# Patient Record
Sex: Male | Born: 1970 | Race: Black or African American | Hispanic: No | Marital: Single | Smoking: Never smoker
Health system: Southern US, Community
[De-identification: ages and names within clinical notes are randomized; demographics above are authoritative.]

## PROBLEM LIST (undated history)

## (undated) DIAGNOSIS — Z789 Other specified health status: Secondary | ICD-10-CM

## (undated) DIAGNOSIS — I10 Essential (primary) hypertension: Secondary | ICD-10-CM

## (undated) HISTORY — DX: Essential (primary) hypertension: I10

---

## 2005-08-30 ENCOUNTER — Emergency Department (HOSPITAL_COMMUNITY): Admission: EM | Admit: 2005-08-30 | Discharge: 2005-08-30 | Payer: Self-pay | Admitting: Emergency Medicine

## 2011-01-28 ENCOUNTER — Inpatient Hospital Stay (HOSPITAL_COMMUNITY)

## 2011-01-28 ENCOUNTER — Emergency Department (HOSPITAL_COMMUNITY)

## 2011-01-28 ENCOUNTER — Inpatient Hospital Stay (HOSPITAL_COMMUNITY)
Admission: EM | Admit: 2011-01-28 | Discharge: 2011-02-05 | DRG: 957 | Disposition: A | Discharge status: Home Health | Attending: General Surgery | Admitting: General Surgery

## 2011-01-28 DIAGNOSIS — T794XXA Traumatic shock, initial encounter: Secondary | ICD-10-CM | POA: Diagnosis present

## 2011-01-28 DIAGNOSIS — S3660XA Unspecified injury of rectum, initial encounter: Secondary | ICD-10-CM | POA: Diagnosis present

## 2011-01-28 DIAGNOSIS — F17200 Nicotine dependence, unspecified, uncomplicated: Secondary | ICD-10-CM | POA: Diagnosis present

## 2011-01-28 DIAGNOSIS — Z22322 Carrier or suspected carrier of Methicillin resistant Staphylococcus aureus: Secondary | ICD-10-CM

## 2011-01-28 DIAGNOSIS — F1020 Alcohol dependence, uncomplicated: Secondary | ICD-10-CM | POA: Diagnosis present

## 2011-01-28 DIAGNOSIS — Y929 Unspecified place or not applicable: Secondary | ICD-10-CM

## 2011-01-28 DIAGNOSIS — I2699 Other pulmonary embolism without acute cor pulmonale: Secondary | ICD-10-CM | POA: Diagnosis not present

## 2011-01-28 DIAGNOSIS — S31809A Unspecified open wound of unspecified buttock, initial encounter: Secondary | ICD-10-CM | POA: Diagnosis present

## 2011-01-28 DIAGNOSIS — R0902 Hypoxemia: Secondary | ICD-10-CM | POA: Diagnosis not present

## 2011-01-28 DIAGNOSIS — K560 Paralytic ileus: Secondary | ICD-10-CM | POA: Diagnosis not present

## 2011-01-28 DIAGNOSIS — D62 Acute posthemorrhagic anemia: Secondary | ICD-10-CM | POA: Diagnosis not present

## 2011-01-28 DIAGNOSIS — I959 Hypotension, unspecified: Secondary | ICD-10-CM | POA: Diagnosis present

## 2011-01-28 DIAGNOSIS — S2190XA Unspecified open wound of unspecified part of thorax, initial encounter: Principal | ICD-10-CM | POA: Diagnosis present

## 2011-01-28 DIAGNOSIS — K659 Peritonitis, unspecified: Secondary | ICD-10-CM | POA: Diagnosis present

## 2011-01-28 DIAGNOSIS — I4891 Unspecified atrial fibrillation: Secondary | ICD-10-CM | POA: Diagnosis not present

## 2011-01-28 DIAGNOSIS — S71009A Unspecified open wound, unspecified hip, initial encounter: Secondary | ICD-10-CM | POA: Diagnosis present

## 2011-01-28 DIAGNOSIS — S71109A Unspecified open wound, unspecified thigh, initial encounter: Secondary | ICD-10-CM | POA: Diagnosis present

## 2011-01-28 DIAGNOSIS — J9600 Acute respiratory failure, unspecified whether with hypoxia or hypercapnia: Secondary | ICD-10-CM | POA: Diagnosis not present

## 2011-01-28 HISTORY — PX: ABDOMINAL SURGERY: SHX537

## 2011-01-28 LAB — COMPREHENSIVE METABOLIC PANEL
ALT: 15 U/L (ref 0–53)
AST: 21 U/L (ref 0–37)
Albumin: 2.9 g/dL — ABNORMAL LOW (ref 3.5–5.2)
Alkaline Phosphatase: 59 U/L (ref 39–117)
BUN: 17 mg/dL (ref 6–23)
Calcium: 9.2 mg/dL (ref 8.4–10.5)
Calcium: 8.2 mg/dL — ABNORMAL LOW (ref 8.4–10.5)
Glucose, Bld: 159 mg/dL — ABNORMAL HIGH (ref 70–99)
Potassium: 4.7 mEq/L (ref 3.5–5.1)
Sodium: 141 mEq/L (ref 135–145)
Sodium: 141 mEq/L (ref 135–145)
Total Protein: 7.5 g/dL (ref 6.0–8.3)
Total Protein: 5.4 g/dL — ABNORMAL LOW (ref 6.0–8.3)

## 2011-01-28 LAB — BLOOD GAS, ARTERIAL
Acid-base deficit: 2.6 mmol/L — ABNORMAL HIGH (ref 0.0–2.0)
Bicarbonate: 21.9 mEq/L (ref 20.0–24.0)
FIO2: 0.7 %
MECHVT: 600 mL
TCO2: 23.1 mmol/L (ref 0–100)
pCO2 arterial: 38.6 mmHg (ref 35.0–45.0)
pO2, Arterial: 132 mmHg — ABNORMAL HIGH (ref 80.0–100.0)

## 2011-01-28 LAB — CBC
HCT: 41 % (ref 39.0–52.0)
Hemoglobin: 13.7 g/dL (ref 13.0–17.0)
MCH: 28.7 pg (ref 26.0–34.0)
MCH: 29.1 pg (ref 26.0–34.0)
MCHC: 33.4 g/dL (ref 30.0–36.0)
MCV: 86 fL (ref 78.0–100.0)
Platelets: 359 K/uL (ref 150–400)
Platelets: 176 10*3/uL (ref 150–400)
RBC: 4.77 MIL/uL (ref 4.22–5.81)
RBC: 3.51 MIL/uL — ABNORMAL LOW (ref 4.22–5.81)
RDW: 13.4 % (ref 11.5–15.5)
RDW: 13.3 % (ref 11.5–15.5)
WBC: 14 10*3/uL — ABNORMAL HIGH (ref 4.0–10.5)

## 2011-01-28 LAB — LACTIC ACID, PLASMA
Lactic Acid, Venous: 20.2 mmol/L — ABNORMAL HIGH (ref 0.5–2.2)
Lactic Acid, Venous: 2.2 mmol/L (ref 0.5–2.2)

## 2011-01-28 LAB — POCT I-STAT 7, (LYTES, BLD GAS, ICA,H+H)
Bicarbonate: 16.8 meq/L — ABNORMAL LOW (ref 20.0–24.0)
Hemoglobin: 12.9 g/dL — ABNORMAL LOW (ref 13.0–17.0)
Patient temperature: 35.4
Potassium: 4.2 meq/L (ref 3.5–5.1)
TCO2: 18 mmol/L (ref 0–100)
pCO2 arterial: 39.8 mmHg (ref 35.0–45.0)
pH, Arterial: 7.224 — ABNORMAL LOW (ref 7.350–7.450)

## 2011-01-28 LAB — POCT I-STAT 3, ART BLOOD GAS (G3+)
Bicarbonate: 23.7 meq/L (ref 20.0–24.0)
O2 Saturation: 96 %
TCO2: 25 mmol/L (ref 0–100)
pCO2 arterial: 46.1 mmHg — ABNORMAL HIGH (ref 35.0–45.0)
pCO2 arterial: 48.6 mmHg — ABNORMAL HIGH (ref 35.0–45.0)
pH, Arterial: 7.316 — ABNORMAL LOW (ref 7.350–7.450)
pH, Arterial: 7.291 — ABNORMAL LOW (ref 7.350–7.450)
pO2, Arterial: 90 mmHg (ref 80.0–100.0)

## 2011-01-28 LAB — COMPREHENSIVE METABOLIC PANEL WITH GFR
ALT: 21 U/L (ref 0–53)
AST: 32 U/L (ref 0–37)
Albumin: 3.8 g/dL (ref 3.5–5.2)
Alkaline Phosphatase: 72 U/L (ref 39–117)
CO2: 9 meq/L — CL (ref 19–32)
Chloride: 100 meq/L (ref 96–112)
Creatinine, Ser: 1.84 mg/dL — ABNORMAL HIGH (ref 0.4–1.5)
Potassium: 3.9 meq/L (ref 3.5–5.1)
Total Bilirubin: 0.9 mg/dL (ref 0.3–1.2)

## 2011-01-28 LAB — POCT I-STAT, CHEM 8
BUN: 20 mg/dL (ref 6–23)
BUN: 21 mg/dL (ref 6–23)
Chloride: 110 meq/L (ref 96–112)
Creatinine, Ser: 1.8 mg/dL — ABNORMAL HIGH (ref 0.4–1.5)
Glucose, Bld: 149 mg/dL — ABNORMAL HIGH (ref 70–99)
HCT: 45 % (ref 39.0–52.0)
Hemoglobin: 9.2 g/dL — ABNORMAL LOW (ref 13.0–17.0)
Potassium: 4 meq/L (ref 3.5–5.1)
Potassium: 4.8 meq/L (ref 3.5–5.1)
Sodium: 140 meq/L (ref 135–145)

## 2011-01-28 LAB — PROTIME-INR
INR: 1.41 (ref 0.00–1.49)
INR: 1.32 (ref 0.00–1.49)
Prothrombin Time: 17.5 seconds — ABNORMAL HIGH (ref 11.6–15.2)
Prothrombin Time: 16.6 seconds — ABNORMAL HIGH (ref 11.6–15.2)

## 2011-01-28 LAB — SAMPLE TO BLOOD BANK

## 2011-01-28 LAB — HEMOGLOBIN AND HEMATOCRIT, BLOOD
HCT: 26.5 % — ABNORMAL LOW (ref 39.0–52.0)
HCT: 26.1 % — ABNORMAL LOW (ref 39.0–52.0)
Hemoglobin: 9.1 g/dL — ABNORMAL LOW (ref 13.0–17.0)

## 2011-01-29 ENCOUNTER — Inpatient Hospital Stay (HOSPITAL_COMMUNITY)

## 2011-01-29 LAB — DIFFERENTIAL
Eosinophils Absolute: 0 10*3/uL (ref 0.0–0.7)
Lymphocytes Relative: 14 % (ref 12–46)
Lymphs Abs: 1.5 10*3/uL (ref 0.7–4.0)
Monocytes Relative: 8 % (ref 3–12)
Neutro Abs: 8.2 10*3/uL — ABNORMAL HIGH (ref 1.7–7.7)
Neutrophils Relative %: 78 % — ABNORMAL HIGH (ref 43–77)

## 2011-01-29 LAB — POCT I-STAT 3, ART BLOOD GAS (G3+)
Acid-base deficit: 2 mmol/L (ref 0.0–2.0)
Acid-base deficit: 4 mmol/L — ABNORMAL HIGH (ref 0.0–2.0)
Acid-base deficit: 4 mmol/L — ABNORMAL HIGH (ref 0.0–2.0)
Bicarbonate: 20.8 meq/L (ref 20.0–24.0)
Bicarbonate: 22.3 meq/L (ref 20.0–24.0)
Bicarbonate: 23.8 meq/L (ref 20.0–24.0)
Bicarbonate: 23.8 meq/L (ref 20.0–24.0)
O2 Saturation: 90 %
Patient temperature: 100.7
Patient temperature: 100.9
Patient temperature: 99
TCO2: 25 mmol/L (ref 0–100)
TCO2: 25 mmol/L (ref 0–100)
pCO2 arterial: 46.4 mmHg — ABNORMAL HIGH (ref 35.0–45.0)
pCO2 arterial: 42.4 mmHg (ref 35.0–45.0)
pH, Arterial: 7.262 — ABNORMAL LOW (ref 7.350–7.450)
pH, Arterial: 7.34 — ABNORMAL LOW (ref 7.350–7.450)
pH, Arterial: 7.411 (ref 7.350–7.450)
pH, Arterial: 7.324 — ABNORMAL LOW (ref 7.350–7.450)
pH, Arterial: 7.363 (ref 7.350–7.450)
pO2, Arterial: 76 mmHg — ABNORMAL LOW (ref 80.0–100.0)

## 2011-01-29 LAB — CBC
HCT: 21.8 % — ABNORMAL LOW (ref 39.0–52.0)
Hemoglobin: 8 g/dL — ABNORMAL LOW (ref 13.0–17.0)
Hemoglobin: 7.6 g/dL — ABNORMAL LOW (ref 13.0–17.0)
MCV: 84.1 fL (ref 78.0–100.0)
MCV: 86.2 fL (ref 78.0–100.0)
Platelets: 177 10*3/uL (ref 150–400)
RBC: 2.71 MIL/uL — ABNORMAL LOW (ref 4.22–5.81)
RBC: 2.53 MIL/uL — ABNORMAL LOW (ref 4.22–5.81)
WBC: 10.6 10*3/uL — ABNORMAL HIGH (ref 4.0–10.5)
WBC: 12.7 10*3/uL — ABNORMAL HIGH (ref 4.0–10.5)

## 2011-01-29 LAB — BLOOD GAS, ARTERIAL
Acid-base deficit: 1.2 mmol/L (ref 0.0–2.0)
Bicarbonate: 22.6 mEq/L (ref 20.0–24.0)
FIO2: 0.5 %
TCO2: 23.6 mmol/L (ref 0–100)
pCO2 arterial: 36.8 mmHg (ref 35.0–45.0)
pH, Arterial: 7.409 (ref 7.350–7.450)
pO2, Arterial: 99.3 mmHg (ref 80.0–100.0)

## 2011-01-29 LAB — BASIC METABOLIC PANEL
CO2: 23 mEq/L (ref 19–32)
Chloride: 108 mEq/L (ref 96–112)
Creatinine, Ser: 1.54 mg/dL — ABNORMAL HIGH (ref 0.4–1.5)
Glucose, Bld: 163 mg/dL — ABNORMAL HIGH (ref 70–99)

## 2011-01-29 LAB — PREPARE FRESH FROZEN PLASMA
Unit division: 0
Unit division: 0
Unit division: 0

## 2011-01-30 ENCOUNTER — Inpatient Hospital Stay (HOSPITAL_COMMUNITY)

## 2011-01-30 LAB — DIFFERENTIAL
Eosinophils Relative: 0 % (ref 0–5)
Lymphocytes Relative: 13 % (ref 12–46)
Lymphs Abs: 1.4 10*3/uL (ref 0.7–4.0)
Monocytes Absolute: 1.1 10*3/uL — ABNORMAL HIGH (ref 0.1–1.0)
Monocytes Relative: 10 % (ref 3–12)

## 2011-01-30 LAB — CBC
HCT: 20.3 % — ABNORMAL LOW (ref 39.0–52.0)
MCHC: 34.5 g/dL (ref 30.0–36.0)
MCV: 87.1 fL (ref 78.0–100.0)
RDW: 14 % (ref 11.5–15.5)
WBC: 11.4 10*3/uL — ABNORMAL HIGH (ref 4.0–10.5)

## 2011-01-30 LAB — BASIC METABOLIC PANEL
BUN: 12 mg/dL (ref 6–23)
Chloride: 105 mEq/L (ref 96–112)
Creatinine, Ser: 1.17 mg/dL (ref 0.4–1.5)
GFR calc Af Amer: >60 mL/min (ref >60)

## 2011-01-31 ENCOUNTER — Inpatient Hospital Stay (HOSPITAL_COMMUNITY)

## 2011-01-31 DIAGNOSIS — I4891 Unspecified atrial fibrillation: Secondary | ICD-10-CM

## 2011-01-31 DIAGNOSIS — I2699 Other pulmonary embolism without acute cor pulmonale: Secondary | ICD-10-CM

## 2011-01-31 LAB — BASIC METABOLIC PANEL
BUN: 11 mg/dL (ref 6–23)
Calcium: 8 mg/dL — ABNORMAL LOW (ref 8.4–10.5)
Creatinine, Ser: 1.08 mg/dL (ref 0.4–1.5)
GFR calc non Af Amer: >60 mL/min (ref >60)
Glucose, Bld: 107 mg/dL — ABNORMAL HIGH (ref 70–99)

## 2011-01-31 LAB — CBC
HCT: 19.3 % — ABNORMAL LOW (ref 39.0–52.0)
HCT: 25.4 % — ABNORMAL LOW (ref 39.0–52.0)
Hemoglobin: 6.6 g/dL — CL (ref 13.0–17.0)
Hemoglobin: 8.6 g/dL — ABNORMAL LOW (ref 13.0–17.0)
MCH: 29.6 pg (ref 26.0–34.0)
MCH: 28.8 pg (ref 26.0–34.0)
MCHC: 34.2 g/dL (ref 30.0–36.0)
MCHC: 33.9 g/dL (ref 30.0–36.0)
MCV: 86.5 fL (ref 78.0–100.0)
MCV: 84.9 fL (ref 78.0–100.0)
RDW: 14.2 % (ref 11.5–15.5)

## 2011-01-31 LAB — DIFFERENTIAL
Basophils Relative: 1 % (ref 0–1)
Monocytes Absolute: 0.9 10*3/uL (ref 0.1–1.0)
Monocytes Relative: 9 % (ref 3–12)
Neutro Abs: 6.2 10*3/uL (ref 1.7–7.7)

## 2011-01-31 MED ORDER — IOHEXOL 300 MG/ML  SOLN
100.0000 mL | Freq: Once | INTRAMUSCULAR | Status: AC | PRN
  Administered 2011-01-31: 75 mL via INTRAVENOUS

## 2011-02-01 ENCOUNTER — Inpatient Hospital Stay (HOSPITAL_COMMUNITY)

## 2011-02-01 DIAGNOSIS — I808 Phlebitis and thrombophlebitis of other sites: Secondary | ICD-10-CM

## 2011-02-01 LAB — TYPE AND SCREEN
ABO/RH(D): O POS
Antibody Screen: NEGATIVE
Unit division: 0
Unit division: 0
Unit division: 0
Unit division: 0
Unit division: 0
Unit division: 0

## 2011-02-01 LAB — BASIC METABOLIC PANEL
BUN: 12 mg/dL (ref 6–23)
Chloride: 103 mEq/L (ref 96–112)
Creatinine, Ser: 1.06 mg/dL (ref 0.4–1.5)
GFR calc Af Amer: >60 mL/min (ref >60)
Glucose, Bld: 119 mg/dL — ABNORMAL HIGH (ref 70–99)
Potassium: 3.7 mEq/L (ref 3.5–5.1)

## 2011-02-01 LAB — DIFFERENTIAL
Basophils Absolute: 0 10*3/uL (ref 0.0–0.1)
Eosinophils Relative: 2 % (ref 0–5)
Lymphocytes Relative: 22 % (ref 12–46)
Lymphs Abs: 1.7 10*3/uL (ref 0.7–4.0)
Monocytes Absolute: 0.8 10*3/uL (ref 0.1–1.0)
Neutro Abs: 5.3 10*3/uL (ref 1.7–7.7)

## 2011-02-01 LAB — CBC
HCT: 25.5 % — ABNORMAL LOW (ref 39.0–52.0)
Hemoglobin: 8.8 g/dL — ABNORMAL LOW (ref 13.0–17.0)
MCHC: 34.5 g/dL (ref 30.0–36.0)
MCV: 84.7 fL (ref 78.0–100.0)
RDW: 14.3 % (ref 11.5–15.5)
WBC: 8 10*3/uL (ref 4.0–10.5)

## 2011-02-02 ENCOUNTER — Inpatient Hospital Stay (HOSPITAL_COMMUNITY)

## 2011-02-02 LAB — CBC
HCT: 27 % — ABNORMAL LOW (ref 39.0–52.0)
MCH: 29.6 pg (ref 26.0–34.0)
MCHC: 34.8 g/dL (ref 30.0–36.0)
MCV: 84.9 fL (ref 78.0–100.0)
Platelets: 246 10*3/uL (ref 150–400)
RDW: 13.9 % (ref 11.5–15.5)

## 2011-02-02 LAB — BASIC METABOLIC PANEL
BUN: 13 mg/dL (ref 6–23)
CO2: 24 mEq/L (ref 19–32)
Calcium: 8.4 mg/dL (ref 8.4–10.5)
Creatinine, Ser: 0.96 mg/dL (ref 0.50–1.35)
GFR calc non Af Amer: >60 mL/min (ref >60)
Glucose, Bld: 117 mg/dL — ABNORMAL HIGH (ref 70–99)

## 2011-02-02 LAB — DIFFERENTIAL
Eosinophils Absolute: 0.1 10*3/uL (ref 0.0–0.7)
Eosinophils Relative: 1 % (ref 0–5)
Lymphocytes Relative: 19 % (ref 12–46)
Lymphs Abs: 1.5 10*3/uL (ref 0.7–4.0)
Monocytes Absolute: 0.8 10*3/uL (ref 0.1–1.0)

## 2011-02-03 ENCOUNTER — Inpatient Hospital Stay (HOSPITAL_COMMUNITY)

## 2011-02-03 LAB — DIFFERENTIAL
Basophils Relative: 0 % (ref 0–1)
Eosinophils Absolute: 0.2 10*3/uL (ref 0.0–0.7)
Eosinophils Relative: 2 % (ref 0–5)
Monocytes Absolute: 1 10*3/uL (ref 0.1–1.0)
Monocytes Relative: 10 % (ref 3–12)
Neutro Abs: 6.4 10*3/uL (ref 1.7–7.7)

## 2011-02-03 LAB — CBC
Hemoglobin: 9.4 g/dL — ABNORMAL LOW (ref 13.0–17.0)
MCH: 28.7 pg (ref 26.0–34.0)
MCHC: 33.6 g/dL (ref 30.0–36.0)
RDW: 14.3 % (ref 11.5–15.5)

## 2011-02-03 LAB — BASIC METABOLIC PANEL
BUN: 16 mg/dL (ref 6–23)
Calcium: 9 mg/dL (ref 8.4–10.5)
Chloride: 106 mEq/L (ref 96–112)
Creatinine, Ser: 1.01 mg/dL (ref 0.50–1.35)
GFR calc Af Amer: >60 mL/min (ref >60)

## 2011-02-03 LAB — PROTIME-INR: Prothrombin Time: 24.6 seconds — ABNORMAL HIGH (ref 11.6–15.2)

## 2011-02-04 ENCOUNTER — Inpatient Hospital Stay (HOSPITAL_COMMUNITY)

## 2011-02-04 LAB — DIFFERENTIAL
Basophils Relative: 1 % (ref 0–1)
Eosinophils Relative: 2 % (ref 0–5)
Lymphs Abs: 2.1 10*3/uL (ref 0.7–4.0)
Monocytes Absolute: 1.2 10*3/uL — ABNORMAL HIGH (ref 0.1–1.0)

## 2011-02-04 LAB — BASIC METABOLIC PANEL
BUN: 17 mg/dL (ref 6–23)
Calcium: 8.9 mg/dL (ref 8.4–10.5)
Creatinine, Ser: 0.96 mg/dL (ref 0.50–1.35)
GFR calc non Af Amer: >60 mL/min (ref >60)
Glucose, Bld: 138 mg/dL — ABNORMAL HIGH (ref 70–99)

## 2011-02-04 LAB — CBC
MCH: 29.2 pg (ref 26.0–34.0)
MCV: 84.2 fL (ref 78.0–100.0)
Platelets: 335 10*3/uL (ref 150–400)
RDW: 14 % (ref 11.5–15.5)

## 2011-02-05 ENCOUNTER — Inpatient Hospital Stay (HOSPITAL_COMMUNITY)

## 2011-02-05 LAB — DIFFERENTIAL
Basophils Absolute: 0 10*3/uL (ref 0.0–0.1)
Basophils Relative: 0 % (ref 0–1)
Monocytes Relative: 10 % (ref 3–12)
Neutro Abs: 6 10*3/uL (ref 1.7–7.7)
Neutrophils Relative %: 65 % (ref 43–77)

## 2011-02-05 LAB — BASIC METABOLIC PANEL
Chloride: 104 mEq/L (ref 96–112)
GFR calc Af Amer: >60 mL/min (ref >60)
Potassium: 4.3 mEq/L (ref 3.5–5.1)
Sodium: 135 mEq/L (ref 135–145)

## 2011-02-05 LAB — PROTIME-INR
INR: 2.66 — ABNORMAL HIGH (ref 0.00–1.49)
Prothrombin Time: 28.8 seconds — ABNORMAL HIGH (ref 11.6–15.2)

## 2011-02-05 LAB — CBC
Hemoglobin: 9.6 g/dL — ABNORMAL LOW (ref 13.0–17.0)
RBC: 3.36 MIL/uL — ABNORMAL LOW (ref 4.22–5.81)
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
## 2011-02-18 NOTE — Op Note (Signed)
  NAME:  Michael Barber, Michael Barber NO.:  [id number]  MEDICAL RECORD NO.:  [id number]  LOCATION:  2311                         FACILITY:  [facility]  PHYSICIAN:  Almond Lint, MD       DATE OF BIRTH:  [birth year]  DATE OF PROCEDURE:  01/28/2011 DATE OF DISCHARGE:                              OPERATIVE REPORT   PREOPERATIVE DIAGNOSIS:  Increased hemothorax.  POSTOPERATIVE DIAGNOSIS:  Increased hemothorax.  PROCEDURE:  Left 40-French tube thoracostomy.  SURGEON:  Almond Lint, MD  ANESTHESIA:  General and local.  FINDINGS:  Around 200 mL of blood on the bed and around 300 mL in the Pleur-Evac.  SPECIMENS:  None.  ESTIMATED BLOOD LOSS:  500.  PROCEDURE:  Mr. Baby was identified in the ICU.  Informed consent was obtained.  His left chest x-ray was reviewed demonstrating increased hemothorax since this morning.  He was given some sedation with Versed and had already gotten some Dilaudid for pain.  His chest was prepped and draped in sterile fashion.  The anterior axillary line was anesthetized with 2% lidocaine.  A #11 blade was used to make an incision.  The tube was secured with 2-0 silk at 12 cm.  The patient originally tolerated the procedure well and became tachycardic.  He was given dose of Narcan and we are awaiting a chest x-ray.     Almond Lint, MD     FB/MEDQ  D:  01/28/2011  T:  01/29/2011  Job:  [id number]  Electronically Signed by Almond Lint MD on 02/18/2011 12:45:21 PM
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
## 2011-03-14 ENCOUNTER — Telehealth (INDEPENDENT_AMBULATORY_CARE_PROVIDER_SITE_OTHER): Payer: Self-pay | Admitting: Orthopedic Surgery

## 2011-03-14 NOTE — Telephone Encounter (Signed)
Patient's mother called requesting a return to work note. Will provide.

## 2011-03-19 ENCOUNTER — Telehealth (INDEPENDENT_AMBULATORY_CARE_PROVIDER_SITE_OTHER): Payer: Self-pay | Admitting: General Surgery

## 2011-03-19 NOTE — Telephone Encounter (Signed)
Patient's sister called to say they need a note from Dr Lindie Spruce for the [employer]. Needs to state that he had an extensive surgery and went into A-fib in recovery and that the patient hasn't been released for back to work yet. Please call sister when note is ready. Thanks.

## 2011-03-21 ENCOUNTER — Encounter (INDEPENDENT_AMBULATORY_CARE_PROVIDER_SITE_OTHER): Payer: Self-pay | Admitting: Orthopedic Surgery

## 2011-03-21 ENCOUNTER — Telehealth (INDEPENDENT_AMBULATORY_CARE_PROVIDER_SITE_OTHER): Payer: Self-pay | Admitting: Orthopedic Surgery

## 2011-03-21 NOTE — Telephone Encounter (Signed)
Britta Mccreedy returned my earlier phone call and asked for the letter to be faxed to [phone].

## 2011-03-21 NOTE — Telephone Encounter (Signed)
Left message to say his work note was ready.

## 2011-04-10 ENCOUNTER — Telehealth (INDEPENDENT_AMBULATORY_CARE_PROVIDER_SITE_OTHER): Payer: Self-pay | Admitting: Orthopedic Surgery

## 2011-04-10 NOTE — Telephone Encounter (Signed)
Received pharmacy request for refill for Norco 10/325. Denied as >2 months from surgery.

## 2018-03-24 ENCOUNTER — Encounter (HOSPITAL_COMMUNITY): Payer: Self-pay | Admitting: Internal Medicine

## 2018-03-24 ENCOUNTER — Encounter: Payer: Self-pay | Admitting: Emergency Medicine

## 2018-03-24 ENCOUNTER — Inpatient Hospital Stay (HOSPITAL_COMMUNITY)
Admission: EM | Admit: 2018-03-24 | Discharge: 2018-03-27 | DRG: 418 | Disposition: A | Discharge status: Home / Self Care | Payer: Self-pay | Source: Other Acute Inpatient Hospital | Attending: Internal Medicine | Admitting: Internal Medicine

## 2018-03-24 ENCOUNTER — Inpatient Hospital Stay
Admission: EM | Admit: 2018-03-24 | Discharge: 2018-03-24 | DRG: 445 | Disposition: A | Discharge status: Other Acute Inpatient Hospital | Payer: Self-pay | Attending: Internal Medicine | Admitting: Internal Medicine

## 2018-03-24 ENCOUNTER — Other Ambulatory Visit: Payer: Self-pay

## 2018-03-24 ENCOUNTER — Inpatient Hospital Stay: Payer: Self-pay

## 2018-03-24 ENCOUNTER — Emergency Department: Payer: Self-pay

## 2018-03-24 DIAGNOSIS — K810 Acute cholecystitis: Secondary | ICD-10-CM | POA: Diagnosis present

## 2018-03-24 DIAGNOSIS — E860 Dehydration: Secondary | ICD-10-CM | POA: Diagnosis present

## 2018-03-24 DIAGNOSIS — R945 Abnormal results of liver function studies: Secondary | ICD-10-CM

## 2018-03-24 DIAGNOSIS — R7989 Other specified abnormal findings of blood chemistry: Secondary | ICD-10-CM | POA: Diagnosis present

## 2018-03-24 DIAGNOSIS — K8000 Calculus of gallbladder with acute cholecystitis without obstruction: Principal | ICD-10-CM | POA: Diagnosis present

## 2018-03-24 DIAGNOSIS — K8063 Calculus of gallbladder and bile duct with acute cholecystitis with obstruction: Principal | ICD-10-CM | POA: Diagnosis present

## 2018-03-24 DIAGNOSIS — N179 Acute kidney failure, unspecified: Secondary | ICD-10-CM | POA: Diagnosis present

## 2018-03-24 DIAGNOSIS — F17290 Nicotine dependence, other tobacco product, uncomplicated: Secondary | ICD-10-CM | POA: Diagnosis present

## 2018-03-24 DIAGNOSIS — Z6832 Body mass index (BMI) 32.0-32.9, adult: Secondary | ICD-10-CM

## 2018-03-24 DIAGNOSIS — I4891 Unspecified atrial fibrillation: Secondary | ICD-10-CM | POA: Diagnosis present

## 2018-03-24 DIAGNOSIS — E669 Obesity, unspecified: Secondary | ICD-10-CM | POA: Diagnosis present

## 2018-03-24 DIAGNOSIS — I10 Essential (primary) hypertension: Secondary | ICD-10-CM | POA: Diagnosis present

## 2018-03-24 DIAGNOSIS — K760 Fatty (change of) liver, not elsewhere classified: Secondary | ICD-10-CM | POA: Diagnosis present

## 2018-03-24 DIAGNOSIS — Z86711 Personal history of pulmonary embolism: Secondary | ICD-10-CM

## 2018-03-24 DIAGNOSIS — R17 Unspecified jaundice: Secondary | ICD-10-CM

## 2018-03-24 HISTORY — DX: Other specified health status: Z78.9

## 2018-03-24 LAB — URINALYSIS, COMPLETE (UACMP) WITH MICROSCOPIC
Bacteria, UA: NONE SEEN
Bilirubin Urine: NEGATIVE
Glucose, UA: NEGATIVE mg/dL
Ketones, ur: NEGATIVE mg/dL
Leukocytes, UA: NEGATIVE
NITRITE: NEGATIVE
PROTEIN: NEGATIVE mg/dL
SPECIFIC GRAVITY, URINE: 1.013 (ref 1.005–1.030)
Squamous Epithelial / LPF: NONE SEEN (ref 0–5)
pH: 5 (ref 5.0–8.0)

## 2018-03-24 LAB — COMPREHENSIVE METABOLIC PANEL
ALT: 202 U/L — ABNORMAL HIGH (ref 0–44)
ANION GAP: 10 (ref 5–15)
AST: 194 U/L — AB (ref 15–41)
Albumin: 4.4 g/dL (ref 3.5–5.0)
Alkaline Phosphatase: 109 U/L (ref 38–126)
BUN: 16 mg/dL (ref 6–20)
CALCIUM: 9.2 mg/dL (ref 8.9–10.3)
CO2: 24 mmol/L (ref 22–32)
CREATININE: 1.48 mg/dL — AB (ref 0.61–1.24)
Chloride: 101 mmol/L (ref 98–111)
GFR, EST NON AFRICAN AMERICAN: 54 mL/min — AB (ref >60)
GLUCOSE: 143 mg/dL — AB (ref 70–99)
Potassium: 4.3 mmol/L (ref 3.5–5.1)
Sodium: 135 mmol/L (ref 135–145)
TOTAL PROTEIN: 8.4 g/dL — AB (ref 6.5–8.1)
Total Bilirubin: 6.2 mg/dL — ABNORMAL HIGH (ref 0.3–1.2)

## 2018-03-24 LAB — LACTIC ACID, PLASMA: Lactic Acid, Venous: 1.3 mmol/L (ref 0.5–1.9)

## 2018-03-24 LAB — CBC
HCT: 43.6 % (ref 40.0–52.0)
HEMOGLOBIN: 15 g/dL (ref 13.0–18.0)
MCH: 29 pg (ref 26.0–34.0)
MCHC: 34.4 g/dL (ref 32.0–36.0)
MCV: 84.2 fL (ref 80.0–100.0)
PLATELETS: 278 10*3/uL (ref 150–440)
RBC: 5.17 MIL/uL (ref 4.40–5.90)
RDW: 13.6 % (ref 11.5–14.5)
WBC: 10.3 10*3/uL (ref 3.8–10.6)

## 2018-03-24 LAB — LIPASE, BLOOD: LIPASE: 43 U/L (ref 11–51)

## 2018-03-24 MED ORDER — ACETAMINOPHEN 650 MG RE SUPP
650.0000 mg | Freq: Four times a day (QID) | RECTAL | Status: DC | PRN
Start: 2018-03-24 — End: 2018-03-24

## 2018-03-24 MED ORDER — PIPERACILLIN-TAZOBACTAM 3.375 G IVPB 30 MIN
3.3750 g | Freq: Once | INTRAVENOUS | Status: AC
  Administered 2018-03-24: 3.375 g via INTRAVENOUS
  Filled 2018-03-24 (×2): qty 50

## 2018-03-24 MED ORDER — ONDANSETRON HCL 4 MG PO TABS: 4.0000 mg | ORAL_TABLET | Freq: Four times a day (QID) | ORAL | Status: DC | PRN

## 2018-03-24 MED ORDER — BISACODYL 5 MG PO TBEC: 5.0000 mg | DELAYED_RELEASE_TABLET | Freq: Every day | ORAL | Status: DC | PRN

## 2018-03-24 MED ORDER — PIPERACILLIN-TAZOBACTAM 3.375 G IVPB
3.3750 g | Freq: Three times a day (TID) | INTRAVENOUS | Status: DC
  Administered 2018-03-24: 3.375 g via INTRAVENOUS
  Filled 2018-03-24 (×4): qty 50

## 2018-03-24 MED ORDER — ACETAMINOPHEN 325 MG PO TABS: 650.0000 mg | ORAL_TABLET | Freq: Four times a day (QID) | ORAL | Status: DC | PRN

## 2018-03-24 MED ORDER — SODIUM CHLORIDE 0.9 % IV SOLN
Freq: Once | INTRAVENOUS | Status: AC
  Administered 2018-03-24: 15:00:00 via INTRAVENOUS

## 2018-03-24 MED ORDER — FLEET ENEMA 7-19 GM/118ML RE ENEM: 1.0000 | ENEMA | Freq: Once | RECTAL | Status: DC | PRN

## 2018-03-24 MED ORDER — SODIUM CHLORIDE 0.9 % IV SOLN
2.0000 g | INTRAVENOUS | Status: DC
  Administered 2018-03-24: 2 g via INTRAVENOUS
  Filled 2018-03-24: qty 2

## 2018-03-24 MED ORDER — LACTATED RINGERS IV BOLUS
1000.0000 mL | Freq: Once | INTRAVENOUS | Status: AC
  Administered 2018-03-24: 1000 mL via INTRAVENOUS

## 2018-03-24 MED ORDER — MORPHINE SULFATE (PF) 4 MG/ML IV SOLN
4.0000 mg | Freq: Once | INTRAVENOUS | Status: AC
  Administered 2018-03-24: 4 mg via INTRAVENOUS
  Filled 2018-03-24: qty 1

## 2018-03-24 MED ORDER — ONDANSETRON HCL 4 MG/2ML IJ SOLN: 4.0000 mg | Freq: Four times a day (QID) | INTRAMUSCULAR | Status: DC | PRN

## 2018-03-24 MED ORDER — HEPARIN SODIUM (PORCINE) 5000 UNIT/ML IJ SOLN
5000.0000 [IU] | Freq: Three times a day (TID) | INTRAMUSCULAR | Status: DC
  Administered 2018-03-24: 5000 [IU] via SUBCUTANEOUS
  Filled 2018-03-24 (×2): qty 1

## 2018-03-24 MED ORDER — SODIUM CHLORIDE 0.9 % IV SOLN
INTRAVENOUS | Status: DC
  Administered 2018-03-24: 17:00:00 via INTRAVENOUS

## 2018-03-24 MED ORDER — HYDROCODONE-ACETAMINOPHEN 5-325 MG PO TABS
1.0000 | ORAL_TABLET | ORAL | Status: DC | PRN
  Administered 2018-03-24: 1 via ORAL
  Filled 2018-03-24: qty 1

## 2018-03-24 MED ORDER — SODIUM CHLORIDE 0.9 % IV SOLN: INTRAVENOUS | Status: DC

## 2018-03-24 MED ORDER — ONDANSETRON HCL 4 MG/2ML IJ SOLN
4.0000 mg | Freq: Four times a day (QID) | INTRAMUSCULAR | Status: DC | PRN
  Administered 2018-03-26: 4 mg via INTRAVENOUS

## 2018-03-24 MED ORDER — GADOBENATE DIMEGLUMINE 529 MG/ML IV SOLN
20.0000 mL | Freq: Once | INTRAVENOUS | Status: AC | PRN
  Administered 2018-03-24: 20 mL via INTRAVENOUS

## 2018-03-24 MED ORDER — ALBUTEROL SULFATE (2.5 MG/3ML) 0.083% IN NEBU: 2.5000 mg | INHALATION_SOLUTION | RESPIRATORY_TRACT | Status: DC | PRN

## 2018-03-24 NOTE — ED Provider Notes (Signed)
[facility] Emergency Department Provider Note       Time seen: ----------------------------------------- 2:24 PM on 03/24/2018 -----------------------------------------   I have reviewed the triage vital signs and the nursing notes.  HISTORY   Chief Complaint Abdominal Pain    HPI Michael SimmeringRobert Barber is a 47 y.o. male with no significant past medical history who presents to the ED for abdominal pain for the past 2 days.  Patient denies any nausea, vomiting or diarrhea, states his bowel movements have been normal.  He is unsure if he had a fever at home but was found to be febrile here, he describes being warm off and on.  Patient states he feels woozy, does not want to do anything other than laying in his bed.  He has noted some yellow discoloration of his eyes.  History reviewed. No pertinent past medical history.  There are no active problems to display for this patient.   Past Surgical History:  Procedure Laterality Date  . ABDOMINAL SURGERY      Allergies Patient has no known allergies.  Social History Social History   Tobacco Use  . Smoking status: Never Smoker  . Smokeless tobacco: Current User  Substance Use Topics  . Alcohol use: Yes  . Drug use: Never   Review of Systems Constitutional: Negative for fever. Eyes: Positive for scleral icterus Cardiovascular: Negative for chest pain. Respiratory: Negative for shortness of breath. Gastrointestinal: Positive for abdominal pain Musculoskeletal: Negative for back pain. Skin: Negative for rash. Neurological: Negative for headaches, focal weakness or numbness.  All systems negative/normal/unremarkable except as stated in the HPI  ____________________________________________   PHYSICAL EXAM:  VITAL SIGNS: ED Triage Vitals  Enc Vitals Group     BP 03/24/18 1131 (!) 150/84     Pulse Rate 03/24/18 1131 (!) 104     Resp 03/24/18 1131 20     Temp 03/24/18 1131 (!) 100.7 F (38.2 C)    Temp Source 03/24/18 1131 Oral     SpO2 03/24/18 1131 97 %     Weight 03/24/18 1132 252 lb (114.3 kg)     Height 03/24/18 1132 5\' 9"  (1.753 m)     Head Circumference --      Peak Flow --      Pain Score 03/24/18 1131 8     Pain Loc --      Pain Edu? --      Excl. in GC? --    Constitutional: Alert and oriented. Well appearing and in no distress. Eyes: Conjunctivae are icteric. Normal extraocular movements. ENT   Head: Normocephalic and atraumatic.   Nose: No congestion/rhinnorhea.   Mouth/Throat: Mucous membranes are moist.   Neck: No stridor. Cardiovascular: Normal rate, regular rhythm. No murmurs, rubs, or gallops. Respiratory: Normal respiratory effort without tachypnea nor retractions. Breath sounds are clear and equal bilaterally. No wheezes/rales/rhonchi. Gastrointestinal: Some right upper quadrant tenderness is noted, normal bowel sounds, right mid quadrant pain as well. Musculoskeletal: Nontender with normal range of motion in extremities. No lower extremity tenderness nor edema. Neurologic:  Normal speech and language. No gross focal neurologic deficits are appreciated.  Skin:  Skin is warm, dry and intact. No rash noted. Psychiatric: Mood and affect are normal. Speech and behavior are normal.  ____________________________________________  ED COURSE:  As part of my medical decision making, I reviewed the following data within the electronic MEDICAL RECORD NUMBER History obtained from family if available, nursing notes, old chart and ekg, as well as notes from prior  ED visits. Patient presented for right-sided abdominal pain, we will assess with labs and imaging as indicated at this time.   Procedures ____________________________________________   LABS (pertinent positives/negatives)  Labs Reviewed  COMPREHENSIVE METABOLIC PANEL - Abnormal; Notable for the following components:      Result Value   Glucose, Bld 143 (*)    Creatinine, Ser 1.48 (*)    Total  Protein 8.4 (*)    AST 194 (*)    ALT 202 (*)    Total Bilirubin 6.2 (*)    GFR calc non Af Amer 54 (*)    All other components within normal limits  URINALYSIS, COMPLETE (UACMP) WITH MICROSCOPIC - Abnormal; Notable for the following components:   Color, Urine AMBER (*)    APPearance CLEAR (*)    Hgb urine dipstick MODERATE (*)    All other components within normal limits  CULTURE, BLOOD (ROUTINE X 2)  CULTURE, BLOOD (ROUTINE X 2)  LIPASE, BLOOD  CBC  LACTIC ACID, PLASMA  HIV ANTIBODY (ROUTINE TESTING)  CBC  COMPREHENSIVE METABOLIC PANEL    RADIOLOGY Images were viewed by me  Right upper quadrant ultrasound IMPRESSION: 1. Cholelithiasis and wall bladder thickening seen with acute cholecystitis, however no sonographic Murphy sign present. 2. Hepatic steatosis/hepatocellular disease. ____________________________________________  DIFFERENTIAL DIAGNOSIS   Cholangitis, cholecystitis, choledocholithiasis, renal colic, hepatitis  FINAL ASSESSMENT AND PLAN  Gallstones, fever, jaundice   Plan: The patient had presented for right upper quadrant pain he was found to be febrile and jaundice. Patient's labs did reveal hyperbilirubinemia and transaminitis. Patient's imaging did reveal significant gallstones as well as gallbladder wall thickening.  He was not significantly tender over the right upper quadrant which is unusual.  I discussed with both GI on-call and general surgery who recommended antibiotics, fluids and observation in the hospital.  He will need an MRCP and likely cholecystectomy eventually.  We did give IV Zosyn here to prevent further infection from developing.  I will discuss with the hospitalist for admission.   Ulice Dash, MD   Note: This note was generated in part or whole with voice recognition software. Voice recognition is usually quite accurate but there are transcription errors that can and very often do occur. I apologize for any typographical  errors that were not detected and corrected.     Emily Filbert, MD 03/24/18 [phone]

## 2018-03-24 NOTE — H&P (Addendum)
History and Physical    Michael Barber ZOX:[id number] DOB: 17-Nov-1970 DOA: 03/24/2018  PCP: Patient, No Pcp Per  Patient coming from: Patient was transferred from [facility]  Chief Complaint: Abdominal pain.  HPI: Michael Barber is a 47 y.o. male with no significant past medical history presents to the ER at [facility] with complaints of right upper quadrant pain with nausea over the last 2 to 4 hours.  Denies any diarrhea.  Denies any fever or chills.  ED Course: In the ER patient's LFTs were found to be around AST of 194 ALT of 200 total bilirubin of 6.2.  Lipase was normal.  Sonogram shows gallstones.  Surgeon at [facility] advised the patient will need GI consult and ERCP.  Since patient may require ERCP patient was transferred to [facility].  Just before transferring patient had MRCP which only showed acute cholecystitis with no choledocholithiasis.  On my exam patient still has tenderness of the right upper quadrant.  Review of Systems: As per HPI, rest all negative.   History reviewed. No pertinent past medical history.  Past Surgical History:  Procedure Laterality Date  . ABDOMINAL SURGERY       reports that he has never smoked. He uses smokeless tobacco. He reports that he drinks alcohol. He reports that he does not use drugs.  No Known Allergies  History reviewed. No pertinent family history.  Prior to Admission medications   Not on File    Physical Exam: There were no vitals filed for this visit.    Constitutional: Moderately built and nourished. Blood pressure is 140/90.  Pulse is 80/min temperature 97.4 respiration 18/min. There were no vitals filed for this visit. Eyes: Anicteric no pallor. ENMT: No discharge from the ears nose or mouth. Neck: No mass felt.  No neck rigidity.  No JVD appreciated. Respiratory: No rhonchi or crepitations. Cardiovascular: S1-S2 heard no murmurs  appreciated. Abdomen: Right upper quadrant tenderness.  No guarding no rigidity no rebound. Musculoskeletal: No edema. Skin: No rash. Neurologic: Alert awake oriented to time place and person.  Moves all extremities. Psychiatric: Appears normal.  Normal affect.   Labs on Admission: I have personally reviewed following labs and imaging studies  CBC: Recent Labs  Lab 03/24/18 1140  WBC 10.3  HGB 15.0  HCT 43.6  MCV 84.2  PLT 278   Basic Metabolic Panel: Recent Labs  Lab 03/24/18 1140  NA 135  K 4.3  CL 101  CO2 24  GLUCOSE 143*  BUN 16  CREATININE 1.48*  CALCIUM 9.2   GFR: Estimated Creatinine Clearance: 75.2 mL/min (A) (by C-G formula based on SCr of 1.48 mg/dL (H)). Liver Function Tests: Recent Labs  Lab 03/24/18 1140  AST 194*  ALT 202*  ALKPHOS 109  BILITOT 6.2*  PROT 8.4*  ALBUMIN 4.4   Recent Labs  Lab 03/24/18 1140  LIPASE 43   No results for input(s): AMMONIA in the last 168 hours. Coagulation Profile: No results for input(s): INR, PROTIME in the last 168 hours. Cardiac Enzymes: No results for input(s): CKTOTAL, CKMB, CKMBINDEX, TROPONINI in the last 168 hours. BNP (last 3 results) No results for input(s): PROBNP in the last 8760 hours. HbA1C: No results for input(s): HGBA1C in the last 72 hours. CBG: No results for input(s): GLUCAP in the last 168 hours. Lipid Profile: No results for input(s): CHOL, HDL, LDLCALC, TRIG, CHOLHDL, LDLDIRECT in the last 72 hours. Thyroid Function Tests: No results for  input(s): TSH, T4TOTAL, FREET4, T3FREE, THYROIDAB in the last 72 hours. Anemia Panel: No results for input(s): VITAMINB12, FOLATE, FERRITIN, TIBC, IRON, RETICCTPCT in the last 72 hours. Urine analysis:    Component Value Date/Time   COLORURINE AMBER (A) 03/24/2018 1140   APPEARANCEUR CLEAR (A) 03/24/2018 1140   LABSPEC 1.013 03/24/2018 1140   PHURINE 5.0 03/24/2018 1140   GLUCOSEU NEGATIVE 03/24/2018 1140   HGBUR MODERATE (A) 03/24/2018 1140    BILIRUBINUR NEGATIVE 03/24/2018 1140   KETONESUR NEGATIVE 03/24/2018 1140   PROTEINUR NEGATIVE 03/24/2018 1140   NITRITE NEGATIVE 03/24/2018 1140   LEUKOCYTESUR NEGATIVE 03/24/2018 1140   Sepsis Labs: @LABRCNTIP (procalcitonin:4,lacticidven:4) )No results found for this or any previous visit (from the past 240 hour(s)).   Radiological Exams on Admission: Mr 3d Recon At Scanner  Result Date: 03/24/2018 CLINICAL DATA:  47 year old male with history of abdominal pain in the epigastric region and right upper quadrant since yesterday described as intermittent without radiation, with some associated nausea and vomiting. Yellow eyes and dark urine. Cholelithiasis with gallbladder wall thickening noted on recent abdominal ultrasound. EXAM: MRI ABDOMEN WITHOUT AND WITH CONTRAST (INCLUDING MRCP) TECHNIQUE: Multiplanar multisequence MR imaging of the abdomen was performed both before and after the administration of intravenous contrast. Heavily T2-weighted images of the biliary and pancreatic ducts were obtained, and three-dimensional MRCP images were rendered by post processing. CONTRAST:  20mL MULTIHANCE GADOBENATE DIMEGLUMINE 529 MG/ML IV SOLN COMPARISON:  No priors. FINDINGS: Lower chest: Unremarkable. Hepatobiliary: No suspicious cystic or solid hepatic lesions are noted. No intra or extrahepatic biliary ductal dilatation. Small defects are noted in the neck of the gallbladder, corresponding to the known gallstones. Gallbladder wall appears edematous. No definite pericholecystic fluid. Common bile duct is normal in caliber measuring 5 mm in the porta hepatis. No definite filling defects within the common bile duct to suggest choledocholithiasis. Pancreas: No pancreatic mass. No pancreatic ductal dilatation noted on MRCP images. No pancreatic or peripancreatic fluid collections or inflammatory changes. Spleen:  Unremarkable. Adrenals/Urinary Tract: Subcentimeter T1 hypointense, T2 hyperintense, nonenhancing  lesion in the upper pole the left kidney is compatible with a tiny simple cyst. No suspicious renal lesions. No hydroureteronephrosis in the visualized portions of the abdomen. Bilateral adrenal glands are normal in appearance. Stomach/Bowel: Visualized portions are unremarkable. Vascular/Lymphatic: No aneurysm identified in the visualized abdominal vasculature. No lymphadenopathy noted in the abdomen. Other: No significant volume of ascites noted in the visualized portions of the peritoneal cavity. Musculoskeletal: No aggressive appearing osseous lesions are noted in the visualized portions of the skeleton. IMPRESSION: 1. Cholelithiasis with diffuse gallbladder wall edema. These findings could indicate an acute cholecystitis, and further clinical evaluation is recommended. 2. No evidence of choledocholithiasis. No findings to suggest biliary tract obstruction. Electronically Signed   By: Trudie Reedaniel  Entrikin M.D.   On: 03/24/2018 21:45   Mr Abdomen Mrcp Vivien RossettiW Wo Contast  Result Date: 03/24/2018 CLINICAL DATA:  47 year old male with history of abdominal pain in the epigastric region and right upper quadrant since yesterday described as intermittent without radiation, with some associated nausea and vomiting. Yellow eyes and dark urine. Cholelithiasis with gallbladder wall thickening noted on recent abdominal ultrasound. EXAM: MRI ABDOMEN WITHOUT AND WITH CONTRAST (INCLUDING MRCP) TECHNIQUE: Multiplanar multisequence MR imaging of the abdomen was performed both before and after the administration of intravenous contrast. Heavily T2-weighted images of the biliary and pancreatic ducts were obtained, and three-dimensional MRCP images were rendered by post processing. CONTRAST:  20mL MULTIHANCE GADOBENATE DIMEGLUMINE 529 MG/ML IV SOLN COMPARISON:  No priors. FINDINGS: Lower chest: Unremarkable. Hepatobiliary: No suspicious cystic or solid hepatic lesions are noted. No intra or extrahepatic biliary ductal dilatation. Small  defects are noted in the neck of the gallbladder, corresponding to the known gallstones. Gallbladder wall appears edematous. No definite pericholecystic fluid. Common bile duct is normal in caliber measuring 5 mm in the porta hepatis. No definite filling defects within the common bile duct to suggest choledocholithiasis. Pancreas: No pancreatic mass. No pancreatic ductal dilatation noted on MRCP images. No pancreatic or peripancreatic fluid collections or inflammatory changes. Spleen:  Unremarkable. Adrenals/Urinary Tract: Subcentimeter T1 hypointense, T2 hyperintense, nonenhancing lesion in the upper pole the left kidney is compatible with a tiny simple cyst. No suspicious renal lesions. No hydroureteronephrosis in the visualized portions of the abdomen. Bilateral adrenal glands are normal in appearance. Stomach/Bowel: Visualized portions are unremarkable. Vascular/Lymphatic: No aneurysm identified in the visualized abdominal vasculature. No lymphadenopathy noted in the abdomen. Other: No significant volume of ascites noted in the visualized portions of the peritoneal cavity. Musculoskeletal: No aggressive appearing osseous lesions are noted in the visualized portions of the skeleton. IMPRESSION: 1. Cholelithiasis with diffuse gallbladder wall edema. These findings could indicate an acute cholecystitis, and further clinical evaluation is recommended. 2. No evidence of choledocholithiasis. No findings to suggest biliary tract obstruction. Electronically Signed   By: Trudie Reed M.D.   On: 03/24/2018 21:45   US Abdomen Limited Ruq  Result Date: 03/24/2018 CLINICAL DATA:  Abdominal pain, transaminitis. EXAM: ULTRASOUND ABDOMEN LIMITED RIGHT UPPER QUADRANT COMPARISON:  None. FINDINGS: Gallbladder: Gallbladder wall thickening at 11 mm. Echogenic suspected calculi measuring to 3 mm at the neck. No pericholecystic fluid. No sonographic Murphy sign elicited. Common bile duct: Diameter: 4 mm Liver: No focal lesion  identified. Increased parenchymal echogenicity. Portal vein is patent on color Doppler imaging with normal direction of blood flow towards the liver. IMPRESSION: 1. Cholelithiasis and wall bladder thickening seen with acute cholecystitis, however no sonographic Murphy sign present. 2. Hepatic steatosis/hepatocellular disease. Electronically Signed   By: Awilda Metro M.D.   On: 03/24/2018 15:13     Assessment/Plan Principal Problem:   Cholecystitis, acute Active Problems:   Elevated LFTs   Acute cholecystitis    1. Acute cholecystitis -patient has been placed on Zosyn.  Will keep patient n.p.o. I will consult general surgery for possible need for surgery.  Pain relief medications. 2. Elevated LFTs likely from cholecystitis.  MRCP does not choledocholithiasis. 3. Acute renal failure likely from dehydration.  Check UA continue hydration.   4. Elevated blood pressure very closely follow blood pressure trends.   DVT prophylaxis: SCDs. Code Status: Full code. Family Communication: Discussed with patient. Disposition Plan: Home. Consults called: General surgery. Admission status: Inpatient.   Eduard Clos MD [facility] Pager [phone].  If 7PM-7AM, please contact night-coverage [email or web address] Password [email or web address]  03/24/2018, 11:42 PM

## 2018-03-24 NOTE — ED Notes (Signed)
EDP to bedside to provide update to patient and family.

## 2018-03-24 NOTE — ED Notes (Signed)
.  FIRST NURSE NOTE: Pt presents with abd pain. Pt is ambulatory, and NAD at this time.

## 2018-03-24 NOTE — ED Notes (Signed)
Hospitalist to bedside at this time 

## 2018-03-24 NOTE — H&P (Signed)
[facility] [place] [facility]   PATIENT NAME: Michael Barber    MR#:  [id number]  DATE OF BIRTH:  Mar 17, 1971  DATE OF ADMISSION:  03/24/2018  PRIMARY CARE PHYSICIAN: Patient, No Pcp Per   REQUESTING/REFERRING PHYSICIAN: Dr. Mayford KnifeWilliams.  CHIEF COMPLAINT:   Chief Complaint  Patient presents with  . Abdominal Pain   Abdominal pain since yesterday. HISTORY OF PRESENT ILLNESS:  Michael Barber  is a 47 y.o. male with no past medical history.  The patient presents the ED with abdominal pain in epigastric area and RUQ since yesterday, which is intermittent, aching without any radiation, associated with nausea and vomiting.  The patient also noticed yellow eyes and dark urine.  He denies any fever or chills but has fever 100.7 in the ED.  Abdominal ultrasound show cholelithiasis and acute cholecystitis.  The on-call surgeon suggested no surgery this time but need GI consult and MRCP.  PAST MEDICAL HISTORY:  History reviewed. No pertinent past medical history.  PAST SURGICAL HISTORY:   Past Surgical History:  Procedure Laterality Date  . ABDOMINAL SURGERY      SOCIAL HISTORY:   Social History   Tobacco Use  . Smoking status: Never Smoker  . Smokeless tobacco: Current User  Substance Use Topics  . Alcohol use: Yes    FAMILY HISTORY:  No family history on file.  The patient denies any family history.  DRUG ALLERGIES:  No Known Allergies  REVIEW OF SYSTEMS:   Review of Systems  Constitutional: Negative for chills, fever and malaise/fatigue.  HENT: Negative for sore throat.   Eyes: Negative for blurred vision and double vision.  Respiratory: Negative for cough, hemoptysis, shortness of breath, wheezing and stridor.   Cardiovascular: Negative for chest pain, palpitations, orthopnea and leg swelling.  Gastrointestinal: Positive for abdominal pain, nausea and vomiting. Negative for blood in stool, diarrhea and melena.  Genitourinary: Negative for  dysuria, flank pain and hematuria.  Musculoskeletal: Negative for back pain and joint pain.  Skin: Negative for rash.  Neurological: Negative for dizziness, sensory change, focal weakness, seizures, loss of consciousness, weakness and headaches.  Endo/Heme/Allergies: Negative for polydipsia.  Psychiatric/Behavioral: Negative for depression. The patient is not nervous/anxious.     MEDICATIONS AT HOME:   Prior to Admission medications   Not on File      VITAL SIGNS:  Blood pressure (!) 149/101, pulse 89, temperature (!) 100.7 F (38.2 C), temperature source Oral, resp. rate 16, height 5\' 9"  (1.753 m), weight 252 lb (114.3 kg), SpO2 99 %.  PHYSICAL EXAMINATION:  Physical Exam  GENERAL:  47 y.o.-year-old patient lying in the bed with no acute distress.  EYES: Pupils equal, round, reactive to light and accommodation. No scleral icterus. Extraocular muscles intact.  HEENT: Head atraumatic, normocephalic. Oropharynx and nasopharynx clear.  NECK:  Supple, no jugular venous distention. No thyroid enlargement, no tenderness.  LUNGS: Normal breath sounds bilaterally, no wheezing, rales,rhonchi or crepitation. No use of accessory muscles of respiration.  CARDIOVASCULAR: S1, S2 normal. No murmurs, rubs, or gallops.  ABDOMEN: Soft, tenderness in RUQ, no Morphis sign, nondistended. Bowel sounds present. No organomegaly or mass.  EXTREMITIES: No pedal edema, cyanosis, or clubbing.  NEUROLOGIC: Cranial nerves II through XII are intact. Muscle strength 5/5 in all extremities. Sensation intact. Gait not checked.  PSYCHIATRIC: The patient is alert and oriented x 3.  SKIN: No obvious rash, lesion, or ulcer.   LABORATORY PANEL:   CBC Recent Labs  Lab 03/24/18 1140  WBC 10.3  HGB 15.0  HCT 43.6  PLT 278   ------------------------------------------------------------------------------------------------------------------  Chemistries  Recent Labs  Lab 03/24/18 1140  NA 135  K 4.3  CL 101    CO2 24  GLUCOSE 143*  BUN 16  CREATININE 1.48*  CALCIUM 9.2  AST 194*  ALT 202*  ALKPHOS 109  BILITOT 6.2*   ------------------------------------------------------------------------------------------------------------------  Cardiac Enzymes No results for input(s): TROPONINI in the last 168 hours. ------------------------------------------------------------------------------------------------------------------  RADIOLOGY:  US Abdomen Limited Ruq  Result Date: 03/24/2018 CLINICAL DATA:  Abdominal pain, transaminitis. EXAM: ULTRASOUND ABDOMEN LIMITED RIGHT UPPER QUADRANT COMPARISON:  None. FINDINGS: Gallbladder: Gallbladder wall thickening at 11 mm. Echogenic suspected calculi measuring to 3 mm at the neck. No pericholecystic fluid. No sonographic Murphy sign elicited. Common bile duct: Diameter: 4 mm Liver: No focal lesion identified. Increased parenchymal echogenicity. Portal vein is patent on color Doppler imaging with normal direction of blood flow towards the liver. IMPRESSION: 1. Cholelithiasis and wall bladder thickening seen with acute cholecystitis, however no sonographic Murphy sign present. 2. Hepatic steatosis/hepatocellular disease. Electronically Signed   By: Awilda Metro M.D.   On: 03/24/2018 15:13      IMPRESSION AND PLAN:   Acute cholecystitis and cholelithiasis with elevated bilirubin and abnormal liver function test. The patient will be admitted to medical floor. Start Rocephin, pain control, MRCP per GI. Keep n.p.o. except medication, IV fluid support.  No surgery this time per surgeon.  Acute renal failure due to dehydration.  IV fluid support and follow-up BMP.  All the records are reviewed and case discussed with ED provider. Management plans discussed with the patient, family and they are in agreement.  CODE STATUS: Full code.  TOTAL TIME TAKING CARE OF THIS PATIENT: 40 minutes.    Shaune Pollack M.D on 03/24/2018 at 4:25 PM  Between 7am to 6pm - Pager  - [phone]  After 6pm go to [email or web address] - Social research officer, government  [facility]  Office  [phone]  CC: Primary care physician; Patient, No Pcp Per   Note: This dictation was prepared with Dragon dictation along with smaller phrase technology. Any transcriptional errors that result from this process are unin

## 2018-03-24 NOTE — ED Notes (Signed)
Patient transported to Ultrasound 
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
## 2018-03-24 NOTE — Progress Notes (Signed)
Patient arrived to [facility]. Patient alert and oriented x4. Patient pain score 8 out of 10. Blood pressure 145/103 on initial assessment.

## 2018-03-24 NOTE — ED Triage Notes (Signed)
Patient reports abdominal pain x2 days. Denies N/V/D. Last bowel movement yesterday was normal. Patient unsure of fever at home but states "I get warm off and on". Patient also says "I'm woozy. I don't want to do anything but lay in bed."

## 2018-03-24 NOTE — Progress Notes (Signed)
Dr. Everlene FarrierPabon spoke to me after evaluating the patient and felt he has cholangitis. And the patient needs an ERCP. We have no oncall gi to do ercp next few weeks as Dr Servando SnareWohl is on vacation. I called and discussed with Dr Imogene Burnhen that the patient needs urgent transfer to [facility] or [facility]. If there is a delay and depending on the patients clinical status may need a percutaneous drainage if can't be transferred soon.

## 2018-03-24 NOTE — Discharge Summary (Addendum)
[facility] [place] [facility]   PATIENT NAME: Michael Barber    MR#:  [id number]  DATE OF BIRTH:  August 25, 1970  DATE OF ADMISSION:  03/24/2018   ADMITTING PHYSICIAN: Shaune PollackQing Rylen Hou, MD  DATE OF DISCHARGE:  03/24/2018  PRIMARY CARE PHYSICIAN: Patient, No Pcp Per   ADMISSION DIAGNOSIS:  abd pain DISCHARGE DIAGNOSIS:  Active Problems:   Cholecystitis, acute  SECONDARY DIAGNOSIS:  History reviewed. No pertinent past medical history. HOSPITAL COURSE:  Michael Barber  is a 47 y.o. male with no past medical history.  The patient presents the ED with abdominal pain in epigastric area and RUQ since yesterday, which is intermittent, aching without any radiation, associated with nausea and vomiting.  The patient also noticed yellow eyes and dark urine.  He denies any fever or chills but has fever 100.7 in the ED.  Abdominal ultrasound show cholelithiasis and acute cholecystitis.  The on-call surgeon suggested no surgery this time but need GI consult and MRCP.  Acute cholecystitis and cholelithiasis with elevated bilirubin and abnormal liver function test. The patient is admitted to medical floor. Start Zosyn, pain control, MRCP per GI. Keep n.p.o. except medication, IV fluid support.  No surgery this time per surgeon.  Acute renal failure due to dehydration.  IV fluid support and follow-up BMP. Dr. Everlene FarrierPabon, the on-call surgeon saw the patient, suggested cholangitis and need for urgent ERCP.  However, the GI physician Dr. Tobi BastosAnna cannot do [facility]CP [facility].  He suggest transferred [facility] to get alert and ERCP.  I discussed with on-call hospitalist with Dr. Clyde LundborgNiu, who kindly accepted the patient for transfer to [facility]. DISCHARGE CONDITIONS:  The patient will be [facility] when bed is available. CONSULTS OBTAINED:  Treatment Team:  Wyline MoodAnna, Kiran, MD Leafy RoPabon, Diego F, MD DRUG ALLERGIES:  No Known Allergies DISCHARGE MEDICATIONS:   Allergies as of 03/24/2018   No  Known Allergies     Medication List    You have not been prescribed any medications.      DISCHARGE INSTRUCTIONS:  See AVS. If you experience worsening of your admission symptoms, develop shortness of breath, life threatening emergency, suicidal or homicidal thoughts you must seek medical attention immediately by calling 911 or calling your MD immediately  if symptoms less severe.  You Must read complete instructions/literature along with all the possible adverse reactions/side effects for all the Medicines you take and that have been prescribed to you. Take any new Medicines after you have completely understood and accpet all the possible adverse reactions/side effects.   Please note  You were cared for by a hospitalist during your hospital stay. If you have any questions about your discharge medications or the care you received while you were in the hospital after you are discharged, you can call the unit and asked to speak with the hospitalist on call if the hospitalist that took care of you is not available. Once you are discharged, your primary care physician will handle any further medical issues. Please note that NO REFILLS for any discharge medications will be authorized once you are discharged, as it is imperative that you return to your primary care physician (or establish a relationship with a primary care physician if you do not have one) for your aftercare needs so that they can reassess your need for medications and monitor your lab values.    On the day of Discharge:  VITAL SIGNS:  Blood pressure (!) 162/88, pulse 88, temperature  99.3 F (37.4 C), temperature source Oral, resp. rate 18, height 5\' 9"  (1.753 m), weight 252 lb (114.3 kg), SpO2 100 %. PHYSICAL EXAMINATION:  GENERAL:  48 y.o.-year-old patient lying in the bed with no acute distress.  EYES: Pupils equal, round, reactive to light and accommodation. No scleral icterus. Extraocular muscles intact.  HEENT: Head  atraumatic, normocephalic. Oropharynx and nasopharynx clear.  NECK:  Supple, no jugular venous distention. No thyroid enlargement, no tenderness.  LUNGS: Normal breath sounds bilaterally, no wheezing, rales,rhonchi or crepitation. No use of accessory muscles of respiration.  CARDIOVASCULAR: S1, S2 normal. No murmurs, rubs, or gallops.  ABDOMEN: Soft, tenderness in RLQ, non-distended. Bowel sounds present. No organomegaly or mass.  EXTREMITIES: No pedal edema, cyanosis, or clubbing.  NEUROLOGIC: Cranial nerves II through XII are intact. Muscle strength 5/5 in all extremities. Sensation intact. Gait not checked.  PSYCHIATRIC: The patient is alert and oriented x 3.  SKIN: No obvious rash, lesion, or ulcer.  DATA REVIEW:   CBC Recent Labs  Lab 03/24/18 1140  WBC 10.3  HGB 15.0  HCT 43.6  PLT 278    Chemistries  Recent Labs  Lab 03/24/18 1140  NA 135  K 4.3  CL 101  CO2 24  GLUCOSE 143*  BUN 16  CREATININE 1.48*  CALCIUM 9.2  AST 194*  ALT 202*  ALKPHOS 109  BILITOT 6.2*     Microbiology Results  No results found for this or any previous visit.  RADIOLOGY:  US Abdomen Limited Ruq  Result Date: 03/24/2018 CLINICAL DATA:  Abdominal pain, transaminitis. EXAM: ULTRASOUND ABDOMEN LIMITED RIGHT UPPER QUADRANT COMPARISON:  None. FINDINGS: Gallbladder: Gallbladder wall thickening at 11 mm. Echogenic suspected calculi measuring to 3 mm at the neck. No pericholecystic fluid. No sonographic Murphy sign elicited. Common bile duct: Diameter: 4 mm Liver: No focal lesion identified. Increased parenchymal echogenicity. Portal vein is patent on color Doppler imaging with normal direction of blood flow towards the liver. IMPRESSION: 1. Cholelithiasis and wall bladder thickening seen with acute cholecystitis, however no sonographic Murphy sign present. 2. Hepatic steatosis/hepatocellular disease. Electronically Signed   By: Awilda Metro M.D.   On: 03/24/2018 15:13     Management plans  discussed with the patient, family and they are in agreement.  CODE STATUS: Full Code   TOTAL TIME TAKING CARE OF THIS PATIENT: 42 minutes.    Shaune Pollack M.D on 03/24/2018 at 7:36 PM  Between 7am to 6pm - Pager - [phone]  After 6pm go to [email or web address] - Social research officer, government  [facility] [place] [facility]  Office  [phone]  CC: Primary care physician; Patient, No Pcp Per   Note: This dictation was prepared with Dragon dictation along with smaller phrase technology. Any transcriptional errors that result from this process are unintentional.

## 2018-03-24 NOTE — Progress Notes (Signed)
[facility] arrived to retrieve pt to transport to [facility]. Pt left in stable condition.

## 2018-03-24 NOTE — Care Management (Signed)
This is a no charge note  Transfer [facility] per Dr. Imogene Burnhen  47 year old male without significant past medical history, presents with 2 days of abdominal pain.   His abdominal pain is located in epigastric area and RUQ. The patient also has jaundice. He denies any fever or chills, but has temperature of 100.7in the ED. Abdominal ultrasound show cholelithiasis and acute cholecystitis. IV zosyn was started. The on-call surgeon suggested no surgery this time, but need GI consult and MRCP. MRCP was done and results are pending. Pt has elevated bilirubin and abnormal liver function test. Dr. Everlene FarrierPabon, the on-call surgeon saw the patient, suggested cholangitis and need for urgent ERCP.  However, the GI physician Dr. Tobi BastosAnna cannot [facility]CP [facility].  He suggest transferred [facility] to get ERCP.   Pt was found to have WBC 10.3, acute renal injury with creatinine 1.48, BUN 16, lipase 43, lactic acid 1.3, negative urinalysis, abnormal liver function with AST 194, ALT 202, total bilirubin 6.2, ALP 109, temperature 100.7, heart rate 104--> 88, oxygen saturation 92 to 100% on room air.  US-RUQ showed: 1. Cholelithiasis and wall bladder thickening seen with acute cholecystitis, however no sonographic Murphy sign present. 2. Hepatic steatosis/hepatocellular disease.  Pt is accepted to Med-surg bed as inpt. Per [facility], On-call GI generally preferres to be called after pt arrives to the floor, therefore I did to request Dr. Imogene Burnhen to consult GI before acceptance of the patient.  Please call GI at patient arrives.    Please call manager of [facility] at [phone] when pt arrives to floor   Lorretta HarpXilin Norvil Martensen, MD  [facility] Pager [phone]  If 7PM-7AM, please contact night-coverage [email or web address] Password [email or web address] 03/24/2018, 8:24 PM

## 2018-03-25 ENCOUNTER — Encounter (HOSPITAL_COMMUNITY): Payer: Self-pay | Admitting: General Practice

## 2018-03-25 LAB — CBC WITH DIFFERENTIAL/PLATELET
Abs Immature Granulocytes: 0 10*3/uL (ref 0.0–0.1)
BASOS ABS: 0 10*3/uL (ref 0.0–0.1)
Basophils Relative: 0 %
EOS ABS: 0 10*3/uL (ref 0.0–0.7)
Eosinophils Relative: 0 %
HEMATOCRIT: 39.7 % (ref 39.0–52.0)
HEMOGLOBIN: 13.1 g/dL (ref 13.0–17.0)
Immature Granulocytes: 1 %
LYMPHS ABS: 0.9 10*3/uL (ref 0.7–4.0)
LYMPHS PCT: 11 %
MCH: 27.8 pg (ref 26.0–34.0)
MCHC: 33 g/dL (ref 30.0–36.0)
MCV: 84.3 fL (ref 78.0–100.0)
Monocytes Absolute: 0.6 10*3/uL (ref 0.1–1.0)
Monocytes Relative: 7 %
Neutro Abs: 6.7 10*3/uL (ref 1.7–7.7)
Neutrophils Relative %: 81 %
Platelets: 234 10*3/uL (ref 150–400)
RBC: 4.71 MIL/uL (ref 4.22–5.81)
RDW: 13.2 % (ref 11.5–15.5)
WBC: 8.2 10*3/uL (ref 4.0–10.5)

## 2018-03-25 LAB — BASIC METABOLIC PANEL
Anion gap: 10 (ref 5–15)
BUN: 11 mg/dL (ref 6–20)
CALCIUM: 8.7 mg/dL — AB (ref 8.9–10.3)
CO2: 23 mmol/L (ref 22–32)
CREATININE: 1.5 mg/dL — AB (ref 0.61–1.24)
Chloride: 104 mmol/L (ref 98–111)
GFR calc non Af Amer: 53 mL/min — ABNORMAL LOW (ref >60)
Glucose, Bld: 106 mg/dL — ABNORMAL HIGH (ref 70–99)
Potassium: 3.8 mmol/L (ref 3.5–5.1)
SODIUM: 137 mmol/L (ref 135–145)

## 2018-03-25 LAB — URINALYSIS, ROUTINE W REFLEX MICROSCOPIC
BACTERIA UA: NONE SEEN
BILIRUBIN URINE: NEGATIVE
GLUCOSE, UA: NEGATIVE mg/dL
KETONES UR: 20 mg/dL — AB
LEUKOCYTES UA: NEGATIVE
NITRITE: NEGATIVE
PH: 6 (ref 5.0–8.0)
Protein, ur: NEGATIVE mg/dL
Specific Gravity, Urine: 1.01 (ref 1.005–1.030)

## 2018-03-25 LAB — HEPATIC FUNCTION PANEL
ALBUMIN: 3.3 g/dL — AB (ref 3.5–5.0)
ALT: 135 U/L — ABNORMAL HIGH (ref 0–44)
AST: 73 U/L — ABNORMAL HIGH (ref 15–41)
Alkaline Phosphatase: 90 U/L (ref 38–126)
BILIRUBIN INDIRECT: 5.5 mg/dL — AB (ref 0.3–0.9)
Bilirubin, Direct: 3.3 mg/dL — ABNORMAL HIGH (ref 0.0–0.2)
TOTAL PROTEIN: 6.9 g/dL (ref 6.5–8.1)
Total Bilirubin: 8.8 mg/dL — ABNORMAL HIGH (ref 0.3–1.2)

## 2018-03-25 MED ORDER — GABAPENTIN 300 MG PO CAPS
300.0000 mg | ORAL_CAPSULE | ORAL | Status: AC
  Administered 2018-03-26: 300 mg via ORAL
  Filled 2018-03-25: qty 1

## 2018-03-25 MED ORDER — PIPERACILLIN-TAZOBACTAM 3.375 G IVPB 30 MIN
3.3750 g | Freq: Once | INTRAVENOUS | Status: AC
  Filled 2018-03-25: qty 50

## 2018-03-25 MED ORDER — SODIUM CHLORIDE 0.9 % IV SOLN
INTRAVENOUS | Status: DC
  Administered 2018-03-26: via INTRAVENOUS

## 2018-03-25 MED ORDER — PIPERACILLIN-TAZOBACTAM 3.375 G IVPB
3.3750 g | Freq: Three times a day (TID) | INTRAVENOUS | Status: DC
  Administered 2018-03-25 – 2018-03-26 (×4): 3.375 g via INTRAVENOUS
  Filled 2018-03-25 (×2): qty 50

## 2018-03-25 MED ORDER — HYDROCODONE-ACETAMINOPHEN 5-325 MG PO TABS
1.0000 | ORAL_TABLET | ORAL | Status: DC | PRN
  Administered 2018-03-25: 1 via ORAL
  Filled 2018-03-25: qty 1

## 2018-03-25 MED ORDER — CELECOXIB 200 MG PO CAPS
400.0000 mg | ORAL_CAPSULE | ORAL | Status: AC
  Administered 2018-03-26: 400 mg via ORAL
  Filled 2018-03-25: qty 2

## 2018-03-25 MED ORDER — SODIUM CHLORIDE 0.9 % IV BOLUS
500.0000 mL | Freq: Once | INTRAVENOUS | Status: AC
  Administered 2018-03-25: 500 mL via INTRAVENOUS

## 2018-03-25 MED ORDER — MORPHINE SULFATE (PF) 2 MG/ML IV SOLN: 2.0000 mg | INTRAVENOUS | Status: DC | PRN

## 2018-03-25 MED ORDER — ACETAMINOPHEN 500 MG PO TABS
1000.0000 mg | ORAL_TABLET | ORAL | Status: AC
  Administered 2018-03-26: 1000 mg via ORAL
  Filled 2018-03-25: qty 2
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
## 2018-03-25 NOTE — Anesthesia Preprocedure Evaluation (Signed)
Anesthesia Evaluation  Patient identified by MRN, date of birth, ID band Patient awake    Reviewed: Allergy & Precautions, NPO status , Patient's Chart, lab work & pertinent test results  Airway Mallampati: II  TM Distance: >3 FB Neck ROM: Full    Dental no notable dental hx. (+) Teeth Intact, Dental Advisory Given   Pulmonary neg pulmonary ROS,    Pulmonary exam normal breath sounds clear to auscultation       Cardiovascular Exercise Tolerance: Good negative cardio ROS Normal cardiovascular exam Rhythm:Regular Rate:Normal     Neuro/Psych negative neurological ROS  negative psych ROS   GI/Hepatic negative GI ROS, Neg liver ROS,   Endo/Other  negative endocrine ROS  Renal/GU negative Renal ROS     Musculoskeletal   Abdominal (+) + obese,   Peds  Hematology negative hematology ROS (+)   Anesthesia Other Findings   Reproductive/Obstetrics                             Lab Results  Component Value Date   WBC 8.2 03/25/2018   HGB 13.1 03/25/2018   HCT 39.7 03/25/2018   MCV 84.3 03/25/2018   PLT 234 03/25/2018    Anesthesia Physical Anesthesia Plan  ASA: I  Anesthesia Plan: General   Post-op Pain Management:    Induction: Intravenous  PONV Risk Score and Plan: Ondansetron and Treatment may vary due to age or medical condition  Airway Management Planned: Oral ETT  Additional Equipment:   Intra-op Plan:   Post-operative Plan: Extubation in OR  Informed Consent: I have reviewed the patients History and Physical, chart, labs and discussed the procedure including the risks, benefits and alternatives for the proposed anesthesia with the patient or authorized representative who has indicated his/her understanding and acceptance.   Dental advisory given  Plan Discussed with:   Anesthesia Plan Comments:         Anesthesia Quick Evaluation
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
## 2018-03-25 NOTE — Progress Notes (Signed)
  We were asked to consult this patient prior to his transfer to [facility] in the thought that he may need ERCP.  On reviewing the chart it appears patient had MRCP prior to transfer and it did not show any evidence of choledocholithiasis.  At this time we will wait for surgery's consultation and will not be seeing the patient unless we are reconsulted.  Please feel free to call us with any concerns, we are happy to see him if needed.  Thank you, Hyacinth MeekerJennifer Paola Flynt, PA-C [facility]

## 2018-03-25 NOTE — Progress Notes (Signed)
Pharmacy Antibiotic Note  Michael SimmeringRobert Barber is a 47 y.o. male admitted on 03/24/2018 with intra-abdominal infection.  Pharmacy has been consulted for Zosyn dosing. SCr 1.48 on admit.  Plan: Zosyn 3.375g IV (30min inf) x1; then 3.375g IV q8h (4h inf) Monitor clinical progress, c/s, renal function F/u de-escalation plan/LOT Rx will s/o consult and monitor peripherally     Temp (24hrs), Avg:99.5 F (37.5 C), Min:99 F (37.2 C), Max:100.7 F (38.2 C)  Recent Labs  Lab 03/24/18 1140 03/24/18 1459  WBC 10.3  --   CREATININE 1.48*  --   LATICACIDVEN  --  1.3    Estimated Creatinine Clearance: 75.2 mL/min (A) (by C-G formula based on SCr of 1.48 mg/dL (H)).    No Known Allergies  Michael BertinHaley Anaysia Barber, PharmD, BCPS Clinical Pharmacist 03/25/2018 12:31 AM

## 2018-03-25 NOTE — Discharge Instructions (Signed)
Please arrive at least 30 min before your appointment to complete your check in paperwork.  If you are unable to arrive 30 min prior to your appointment time we may have to cancel or reschedule you. ° °LAPAROSCOPIC SURGERY: POST OP INSTRUCTIONS  °1. DIET: Follow a light bland diet the first 24 hours after arrival home, such as soup, liquids, crackers, etc. Be sure to include lots of fluids daily. Avoid fast food or heavy meals as your are more likely to get nauseated. Eat a low fat the next few days after surgery.  °2. Take your usually prescribed home medications unless otherwise directed. °3. PAIN CONTROL:  °1. Pain is best controlled by a usual combination of three different methods TOGETHER:  °1. Ice/Heat °2. Over the counter pain medication °3. Prescription pain medication °2. Most patients will experience some swelling and bruising around the incisions. Ice packs or heating pads (30-60 minutes up to 6 times a day) will help. Use ice for the first few days to help decrease swelling and bruising, then switch to heat to help relax tight/sore spots and speed recovery. Some people prefer to use ice alone, heat alone, alternating between ice & heat. Experiment to what works for you. Swelling and bruising can take several weeks to resolve.  °3. It is helpful to take an over-the-counter pain medication regularly for the first few weeks. Choose one of the following that works best for you:  °1. Naproxen (Aleve, etc) Two 220mg tabs twice a day °2. Ibuprofen (Advil, etc) Three 200mg tabs four times a day (every meal & bedtime) °3. Acetaminophen (Tylenol, etc) 500-650mg four times a day (every meal & bedtime) °4. A prescription for pain medication (such as oxycodone, hydrocodone, etc) should be given to you upon discharge. Take your pain medication as prescribed.  °1. If you are having problems/concerns with the prescription medicine (does not control pain, nausea, vomiting, rash, itching, etc), please call us [phone]  [phone] to see if we need to switch you to a different pain medicine that will work better for you and/or control your side effect better. °2. If you need a refill on your pain medication, please contact your pharmacy. They will contact our office to request authorization. Prescriptions will not be filled after 5 pm or on week-ends. °4. Avoid getting constipated. Between the surgery and the pain medications, it is common to experience some constipation. Increasing fluid intake and taking a fiber supplement (such as Metamucil, Citrucel, FiberCon, MiraLax, etc) 1-2 times a day regularly will usually help prevent this problem from occurring. A mild laxative (prune juice, Milk of Magnesia, MiraLax, etc) should be taken according to package directions if there are no bowel movements after 48 hours.  °5. Watch out for diarrhea. If you have many loose bowel movements, simplify your diet to bland foods & liquids for a few days. Stop any stool softeners and decrease your fiber supplement. Switching to mild anti-diarrheal medications (Kayopectate, Pepto Bismol) can help. If this worsens or does not improve, please call us. °6. Wash / shower every day. You may shower over the dressings as they are waterproof. Continue to shower over incision(s) after the dressing is off. °7. Remove your waterproof bandages 5 days after surgery. You may leave the incision open to air. You may replace a dressing/Band-Aid to cover the incision for comfort if you wish.  °8. ACTIVITIES as tolerated:  °1. You may resume regular (light) daily activities beginning the next day--such as daily self-care, walking, climbing stairs--gradually   increasing activities as tolerated. If you can walk 30 minutes without difficulty, it is safe to try more intense activity such as jogging, treadmill, bicycling, low-impact aerobics, swimming, etc. °2. Save the most intensive and strenuous activity for last such as sit-ups, heavy lifting, contact sports, etc Refrain  from any heavy lifting or straining until you are off narcotics for pain control.  °3. DO NOT PUSH THROUGH PAIN. Let pain be your guide: If it hurts to do something, don't do it. Pain is your body warning you to avoid that activity for another week until the pain goes down. °4. You may drive when you are no longer taking prescription pain medication, you can comfortably wear a seatbelt, and you can safely maneuver your car and apply brakes. °5. You may have sexual intercourse when it is comfortable.  °9. FOLLOW UP in our office  °1. Please call [facility] at [phone] to set up an appointment to see your surgeon in the office for a follow-up appointment approximately 2-3 weeks after your surgery. °2. Make sure that you call for this appointment the day you arrive home to insure a convenient appointment time. °     10. IF YOU HAVE DISABILITY OR FAMILY LEAVE FORMS, BRING THEM TO THE               OFFICE FOR PROCESSING.  ° °WHEN TO CALL US [phone]:  °1. Poor pain control °2. Reactions / problems with new medications (rash/itching, nausea, etc)  °3. Fever over 101.5 F (38.5 C) °4. Inability to urinate °5. Nausea and/or vomiting °6. Worsening swelling or bruising °7. Continued bleeding from incision. °8. Increased pain, redness, or drainage from the incision ° °The clinic staff is available to answer your questions during regular business hours (8:30am-5pm). Please don’t hesitate to call and ask to speak to one of our nurses for clinical concerns.  °If you have a medical emergency, go to the nearest emergency room or call 911.  °A surgeon from [facility] is always on call at the hospitals  ° °[facility]  °[address], [address], [place], [place] [address] ?  °MAIN: [phone] ? TOLL FREE: [phone] ?  °FAX [phone]  °[email or web address] ° °

## 2018-03-26 ENCOUNTER — Encounter (HOSPITAL_COMMUNITY): Payer: Self-pay | Admitting: Anesthesiology

## 2018-03-26 ENCOUNTER — Encounter (HOSPITAL_COMMUNITY)
Admission: EM | Disposition: A | Discharge status: Home / Self Care | Payer: Self-pay | Source: Other Acute Inpatient Hospital | Attending: Internal Medicine

## 2018-03-26 ENCOUNTER — Inpatient Hospital Stay (HOSPITAL_COMMUNITY): Payer: Self-pay | Admitting: Anesthesiology

## 2018-03-26 DIAGNOSIS — K810 Acute cholecystitis: Secondary | ICD-10-CM

## 2018-03-26 DIAGNOSIS — I10 Essential (primary) hypertension: Secondary | ICD-10-CM

## 2018-03-26 DIAGNOSIS — R945 Abnormal results of liver function studies: Secondary | ICD-10-CM

## 2018-03-26 HISTORY — PX: CHOLECYSTECTOMY: SHX55

## 2018-03-26 LAB — BASIC METABOLIC PANEL
Anion gap: 9 (ref 5–15)
BUN: 6 mg/dL (ref 6–20)
CO2: 23 mmol/L (ref 22–32)
Calcium: 8.6 mg/dL — ABNORMAL LOW (ref 8.9–10.3)
Chloride: 103 mmol/L (ref 98–111)
Creatinine, Ser: 1.51 mg/dL — ABNORMAL HIGH (ref 0.61–1.24)
GFR calc non Af Amer: 53 mL/min — ABNORMAL LOW (ref >60)
Glucose, Bld: 201 mg/dL — ABNORMAL HIGH (ref 70–99)
Potassium: 3.7 mmol/L (ref 3.5–5.1)
SODIUM: 135 mmol/L (ref 135–145)

## 2018-03-26 LAB — HEPATIC FUNCTION PANEL
ALT: 94 U/L — ABNORMAL HIGH (ref 0–44)
AST: 40 U/L (ref 15–41)
Albumin: 3.1 g/dL — ABNORMAL LOW (ref 3.5–5.0)
Alkaline Phosphatase: 84 U/L (ref 38–126)
BILIRUBIN DIRECT: 3 mg/dL — AB (ref 0.0–0.2)
BILIRUBIN TOTAL: 6.4 mg/dL — AB (ref 0.3–1.2)
Indirect Bilirubin: 3.4 mg/dL — ABNORMAL HIGH (ref 0.3–0.9)
Total Protein: 6.7 g/dL (ref 6.5–8.1)

## 2018-03-26 LAB — CBC
HCT: 39 % (ref 39.0–52.0)
Hemoglobin: 12.9 g/dL — ABNORMAL LOW (ref 13.0–17.0)
MCH: 28.4 pg (ref 26.0–34.0)
MCHC: 33.1 g/dL (ref 30.0–36.0)
MCV: 85.7 fL (ref 78.0–100.0)
Platelets: 209 10*3/uL (ref 150–400)
RBC: 4.55 MIL/uL (ref 4.22–5.81)
RDW: 13.4 % (ref 11.5–15.5)
WBC: 4.1 10*3/uL (ref 4.0–10.5)

## 2018-03-26 LAB — HEPATITIS PANEL, ACUTE
HEP A IGM: NEGATIVE
Hep B C IgM: NEGATIVE
Hepatitis B Surface Ag: NEGATIVE

## 2018-03-26 LAB — SURGICAL PCR SCREEN
MRSA, PCR: NEGATIVE
Staphylococcus aureus: POSITIVE — AB

## 2018-03-26 SURGERY — LAPAROSCOPIC CHOLECYSTECTOMY
Anesthesia: General | Site: Abdomen

## 2018-03-26 MED ORDER — FENTANYL CITRATE (PF) 250 MCG/5ML IJ SOLN
INTRAMUSCULAR | Status: AC
  Filled 2018-03-26: qty 5

## 2018-03-26 MED ORDER — DEXAMETHASONE SODIUM PHOSPHATE 10 MG/ML IJ SOLN
INTRAMUSCULAR | Status: AC
  Filled 2018-03-26: qty 1

## 2018-03-26 MED ORDER — SODIUM CHLORIDE 0.9 % IR SOLN
Status: DC | PRN
  Administered 2018-03-26: 1000 mL

## 2018-03-26 MED ORDER — PROPOFOL 10 MG/ML IV BOLUS
INTRAVENOUS | Status: DC | PRN
  Administered 2018-03-26: 150 mg via INTRAVENOUS

## 2018-03-26 MED ORDER — AMLODIPINE BESYLATE 5 MG PO TABS
5.0000 mg | ORAL_TABLET | Freq: Every day | ORAL | Status: DC
  Administered 2018-03-26 – 2018-03-27 (×2): 5 mg via ORAL
  Filled 2018-03-26 (×2): qty 1

## 2018-03-26 MED ORDER — LACTATED RINGERS IV SOLN
INTRAVENOUS | Status: DC | PRN
  Administered 2018-03-26: 09:00:00 via INTRAVENOUS

## 2018-03-26 MED ORDER — EPHEDRINE SULFATE-NACL 50-0.9 MG/10ML-% IV SOSY
PREFILLED_SYRINGE | INTRAVENOUS | Status: DC | PRN
  Administered 2018-03-26 (×2): 10 mg via INTRAVENOUS

## 2018-03-26 MED ORDER — PROPOFOL 10 MG/ML IV BOLUS
INTRAVENOUS | Status: AC
  Filled 2018-03-26: qty 40

## 2018-03-26 MED ORDER — ROCURONIUM BROMIDE 10 MG/ML (PF) SYRINGE
PREFILLED_SYRINGE | INTRAVENOUS | Status: AC
  Filled 2018-03-26: qty 10

## 2018-03-26 MED ORDER — MIDAZOLAM HCL 2 MG/2ML IJ SOLN
INTRAMUSCULAR | Status: AC
  Filled 2018-03-26: qty 2

## 2018-03-26 MED ORDER — ENSURE PRE-SURGERY PO LIQD
296.0000 mL | Freq: Once | ORAL | Status: AC
  Administered 2018-03-26: 296 mL via ORAL
  Filled 2018-03-26: qty 296

## 2018-03-26 MED ORDER — CHLORHEXIDINE GLUCONATE CLOTH 2 % EX PADS
6.0000 | MEDICATED_PAD | Freq: Every day | CUTANEOUS | Status: DC
  Administered 2018-03-27: 6 via TOPICAL

## 2018-03-26 MED ORDER — SUGAMMADEX SODIUM 200 MG/2ML IV SOLN
INTRAVENOUS | Status: DC | PRN
  Administered 2018-03-26: 400 mg via INTRAVENOUS

## 2018-03-26 MED ORDER — MEPERIDINE HCL 50 MG/ML IJ SOLN: 6.2500 mg | INTRAMUSCULAR | Status: DC | PRN

## 2018-03-26 MED ORDER — DEXAMETHASONE SODIUM PHOSPHATE 4 MG/ML IJ SOLN
INTRAMUSCULAR | Status: DC | PRN
  Administered 2018-03-26: 10 mg via INTRAVENOUS

## 2018-03-26 MED ORDER — 0.9 % SODIUM CHLORIDE (POUR BTL) OPTIME
TOPICAL | Status: DC | PRN
  Administered 2018-03-26: 1000 mL

## 2018-03-26 MED ORDER — HYDROMORPHONE HCL 1 MG/ML IJ SOLN
0.2500 mg | INTRAMUSCULAR | Status: DC | PRN
  Administered 2018-03-26 (×2): 0.25 mg via INTRAVENOUS

## 2018-03-26 MED ORDER — LIDOCAINE 2% (20 MG/ML) 5 ML SYRINGE
INTRAMUSCULAR | Status: DC | PRN
  Administered 2018-03-26: 100 mg via INTRAVENOUS

## 2018-03-26 MED ORDER — PHENYLEPHRINE 40 MCG/ML (10ML) SYRINGE FOR IV PUSH (FOR BLOOD PRESSURE SUPPORT)
PREFILLED_SYRINGE | INTRAVENOUS | Status: AC
  Filled 2018-03-26: qty 10

## 2018-03-26 MED ORDER — HYDROMORPHONE HCL 1 MG/ML IJ SOLN
INTRAMUSCULAR | Status: AC
  Filled 2018-03-26: qty 1

## 2018-03-26 MED ORDER — HYDROCODONE-ACETAMINOPHEN 7.5-325 MG PO TABS: 1.0000 | ORAL_TABLET | Freq: Once | ORAL | Status: DC | PRN

## 2018-03-26 MED ORDER — MUPIROCIN 2 % EX OINT
1.0000 "application " | TOPICAL_OINTMENT | Freq: Two times a day (BID) | CUTANEOUS | Status: DC
  Administered 2018-03-26 – 2018-03-27 (×2): 1 via NASAL
  Filled 2018-03-26 (×2): qty 22

## 2018-03-26 MED ORDER — BUPIVACAINE HCL (PF) 0.25 % IJ SOLN
INTRAMUSCULAR | Status: AC
  Filled 2018-03-26: qty 30

## 2018-03-26 MED ORDER — CLONIDINE HCL 0.2 MG PO TABS
0.2000 mg | ORAL_TABLET | Freq: Once | ORAL | Status: AC
  Administered 2018-03-26: 0.2 mg via ORAL
  Filled 2018-03-26: qty 1

## 2018-03-26 MED ORDER — BUPIVACAINE HCL 0.25 % IJ SOLN
INTRAMUSCULAR | Status: DC | PRN
  Administered 2018-03-26: 5 mL

## 2018-03-26 MED ORDER — ROCURONIUM BROMIDE 100 MG/10ML IV SOLN
INTRAVENOUS | Status: DC | PRN
  Administered 2018-03-26: 60 mg via INTRAVENOUS

## 2018-03-26 MED ORDER — ACETAMINOPHEN 325 MG PO TABS
650.0000 mg | ORAL_TABLET | Freq: Four times a day (QID) | ORAL | Status: DC
  Administered 2018-03-26 – 2018-03-27 (×5): 650 mg via ORAL
  Filled 2018-03-26 (×5): qty 2

## 2018-03-26 MED ORDER — FENTANYL CITRATE (PF) 100 MCG/2ML IJ SOLN
INTRAMUSCULAR | Status: DC | PRN
  Administered 2018-03-26 (×2): 50 ug via INTRAVENOUS

## 2018-03-26 MED ORDER — MIDAZOLAM HCL 5 MG/5ML IJ SOLN
INTRAMUSCULAR | Status: DC | PRN
  Administered 2018-03-26: 2 mg via INTRAVENOUS

## 2018-03-26 MED ORDER — PHENYLEPHRINE 40 MCG/ML (10ML) SYRINGE FOR IV PUSH (FOR BLOOD PRESSURE SUPPORT)
PREFILLED_SYRINGE | INTRAVENOUS | Status: DC | PRN
  Administered 2018-03-26: 80 ug via INTRAVENOUS
  Administered 2018-03-26: 120 ug via INTRAVENOUS

## 2018-03-26 MED ORDER — STERILE WATER FOR IRRIGATION IR SOLN
Status: DC | PRN
  Administered 2018-03-26: 1000 mL

## 2018-03-26 MED ORDER — SODIUM CHLORIDE 0.9 % IV SOLN
INTRAVENOUS | Status: DC
  Administered 2018-03-26 – 2018-03-27 (×2): via INTRAVENOUS

## 2018-03-26 MED ORDER — ONDANSETRON HCL 4 MG/2ML IJ SOLN
INTRAMUSCULAR | Status: AC
  Filled 2018-03-26: qty 2

## 2018-03-26 MED ORDER — IBUPROFEN 400 MG PO TABS
400.0000 mg | ORAL_TABLET | Freq: Three times a day (TID) | ORAL | Status: DC
  Administered 2018-03-26 – 2018-03-27 (×4): 400 mg via ORAL
  Filled 2018-03-26 (×4): qty 1

## 2018-03-26 MED ORDER — ACETAMINOPHEN 10 MG/ML IV SOLN: 1000.0000 mg | Freq: Once | INTRAVENOUS | Status: DC | PRN

## 2018-03-26 MED ORDER — ONDANSETRON HCL 4 MG/2ML IJ SOLN: 4.0000 mg | Freq: Once | INTRAMUSCULAR | Status: DC | PRN

## 2018-03-26 MED ORDER — OXYCODONE HCL 5 MG PO TABS
5.0000 mg | ORAL_TABLET | Freq: Four times a day (QID) | ORAL | Status: DC | PRN
  Administered 2018-03-26 – 2018-03-27 (×2): 5 mg via ORAL
  Filled 2018-03-26 (×2): qty 1

## 2018-03-26 MED ORDER — EPHEDRINE 5 MG/ML INJ
INTRAVENOUS | Status: AC
  Filled 2018-03-26: qty 10

## 2018-03-26 SURGICAL SUPPLY — 36 items
CANISTER SUCT 3000ML PPV (MISCELLANEOUS) ×3 IMPLANT
CHLORAPREP W/TINT 26ML (MISCELLANEOUS) ×3 IMPLANT
CLIP VESOLOCK MED LG 6/CT (CLIP) ×6 IMPLANT
COVER SURGICAL LIGHT HANDLE (MISCELLANEOUS) ×3 IMPLANT
COVER TRANSDUCER ULTRASND (DRAPES) ×3 IMPLANT
DEFOGGER SCOPE WARMER CLEARIFY (MISCELLANEOUS) IMPLANT
DERMABOND ADVANCED (GAUZE/BANDAGES/DRESSINGS) ×2
DERMABOND ADVANCED .7 DNX12 (GAUZE/BANDAGES/DRESSINGS) ×1 IMPLANT
ELECT REM PT RETURN 9FT ADLT (ELECTROSURGICAL) ×3
ELECTRODE REM PT RTRN 9FT ADLT (ELECTROSURGICAL) ×1 IMPLANT
GLOVE BIO SURGEON STRL SZ7.5 (GLOVE) ×3 IMPLANT
GOWN STRL REUS W/ TWL LRG LVL3 (GOWN DISPOSABLE) ×2 IMPLANT
GOWN STRL REUS W/ TWL XL LVL3 (GOWN DISPOSABLE) ×1 IMPLANT
GOWN STRL REUS W/TWL LRG LVL3 (GOWN DISPOSABLE) ×4
GOWN STRL REUS W/TWL XL LVL3 (GOWN DISPOSABLE) ×2
GRASPER SUT TROCAR 14GX15 (MISCELLANEOUS) ×3 IMPLANT
KIT BASIN OR (CUSTOM PROCEDURE TRAY) ×3 IMPLANT
KIT TURNOVER KIT B (KITS) ×3 IMPLANT
NEEDLE INSUFFLATION 14GA 120MM (NEEDLE) ×3 IMPLANT
NS IRRIG 1000ML POUR BTL (IV SOLUTION) ×3 IMPLANT
PAD ARMBOARD 7.5X6 YLW CONV (MISCELLANEOUS) ×3 IMPLANT
POUCH LAPAROSCOPIC INSTRUMENT (MISCELLANEOUS) IMPLANT
POUCH RETRIEVAL ECOSAC 10 (ENDOMECHANICALS) IMPLANT
POUCH RETRIEVAL ECOSAC 10MM (ENDOMECHANICALS)
SCISSORS LAP 5X35 DISP (ENDOMECHANICALS) ×3 IMPLANT
SET IRRIG TUBING LAPAROSCOPIC (IRRIGATION / IRRIGATOR) ×3 IMPLANT
SLEEVE ENDOPATH XCEL 5M (ENDOMECHANICALS) ×6 IMPLANT
SPECIMEN JAR SMALL (MISCELLANEOUS) ×3 IMPLANT
SUT MNCRL AB 4-0 PS2 18 (SUTURE) ×6 IMPLANT
TOWEL OR 17X24 6PK STRL BLUE (TOWEL DISPOSABLE) ×3 IMPLANT
TOWEL OR 17X26 10 PK STRL BLUE (TOWEL DISPOSABLE) ×3 IMPLANT
TRAY LAPAROSCOPIC MC (CUSTOM PROCEDURE TRAY) ×3 IMPLANT
TROCAR XCEL NON-BLD 11X100MML (ENDOMECHANICALS) ×3 IMPLANT
TROCAR XCEL NON-BLD 5MMX100MML (ENDOMECHANICALS) ×3 IMPLANT
TUBING INSUFFLATION (TUBING) ×3 IMPLANT
WATER STERILE IRR 1000ML POUR (IV SOLUTION) ×3 IMPLANT

## 2018-03-26 NOTE — Anesthesia Procedure Notes (Signed)
Procedure Name: Intubation Date/Time: 03/26/2018 8:36 AM Performed by: Caren Macadamarter, Denell Cothern W, CRNA Pre-anesthesia Checklist: Patient identified, Emergency Drugs available, Suction available and Patient being monitored Patient Re-evaluated:Patient Re-evaluated prior to induction Oxygen Delivery Method: Circle system utilized Preoxygenation: Pre-oxygenation with 100% oxygen Induction Type: IV induction Ventilation: Mask ventilation without difficulty Laryngoscope Size: Miller and 2 Grade View: Grade I Tube type: Oral Tube size: 7.5 mm Number of attempts: 1 Airway Equipment and Method: Stylet and Oral airway Placement Confirmation: ETT inserted through vocal cords under direct vision,  positive ETCO2 and breath sounds checked- equal and bilateral Secured at: 24 cm Tube secured with: Tape Dental Injury: Teeth and Oropharynx as per pre-operative assessment

## 2018-03-26 NOTE — Transfer of Care (Signed)
Immediate Anesthesia Transfer of Care Note  Patient: Michael SimmeringRobert Brecheen  Procedure(s) Performed: LAPAROSCOPIC CHOLECYSTECTOMY (N/A Abdomen)  Patient Location: PACU  Anesthesia Type:General  Level of Consciousness: awake  Airway & Oxygen Therapy: Patient Spontanous Breathing and Patient connected to face mask oxygen  Post-op Assessment: Report given to RN and Post -op Vital signs reviewed and stable  Post vital signs: Reviewed and stable  Last Vitals:  Vitals Value Taken Time  BP 158/108 03/26/2018  9:45 AM  Temp    Pulse 90 03/26/2018  9:46 AM  Resp 25 03/26/2018  9:46 AM  SpO2 100 % 03/26/2018  9:46 AM  Vitals shown include unvalidated device data.  Last Pain:  Vitals:   03/26/18 0451  TempSrc: Oral  PainSc:       Patients Stated Pain Goal: 3 (03/25/18 2148)  Complications: No apparent anesthesia complications

## 2018-03-26 NOTE — Anesthesia Postprocedure Evaluation (Signed)
Anesthesia Post Note  Patient: Kellie SimmeringRobert Mcgurk  Procedure(s) Performed: LAPAROSCOPIC CHOLECYSTECTOMY (N/A Abdomen)     Patient location during evaluation: PACU Anesthesia Type: General Level of consciousness: awake and alert Pain management: pain level controlled Vital Signs Assessment: post-procedure vital signs reviewed and stable Respiratory status: spontaneous breathing, nonlabored ventilation, respiratory function stable and patient connected to nasal cannula oxygen Cardiovascular status: blood pressure returned to baseline and stable Postop Assessment: no apparent nausea or vomiting Anesthetic complications: no    Last Vitals:  Vitals:   03/26/18 1115 03/26/18 1431  BP:  (!) 156/95  Pulse:  (!) 59  Resp:  16  Temp:  36.7 C  SpO2: 95% 95%    Last Pain:  Vitals:   03/26/18 1635  TempSrc:   PainSc: 6                  Trevor IhaStephen A Cohan Stipes
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
## 2018-03-26 NOTE — Op Note (Addendum)
03/26/2018  9:21 AM  PATIENT:  Michael Barber  47 y.o. male  PRE-OPERATIVE DIAGNOSIS:  ACUTE CHOLECYSTITIS  POST-OPERATIVE DIAGNOSIS:  ACUTE CHOLECYSTITIS, CHOLELITHIASIS  PROCEDURE:  Procedure(s): LAPAROSCOPIC CHOLECYSTECTOMY (N/A)  SURGEON:  Surgeon(s) and Role:    Axel Filler* Teyana Pierron, MD - Primary  Assistant: Bailey MechLiz Simaan, PA-C  ANESTHESIA:   local and general  EBL:  20 mL   BLOOD ADMINISTERED:none  DRAINS: none   LOCAL MEDICATIONS USED:  BUPIVICAINE   SPECIMEN:  Source of Specimen:  GALLBLADDER  DISPOSITION OF SPECIMEN:  PATHOLOGY  COUNTS:  YES  TOURNIQUET:  * No tourniquets in log *  DICTATION: .Dragon Dictation The patient was taken to the operating and placed in the supine position with bilateral SCDs in place.  The patient was prepped and draped in the usual sterile fashion. A time out was called and all facts were verified. A pneumoperitoneum was obtained via A Veress needle technique to a pressure of 14mm of mercury.  A 5mm trochar was then placed in the right upper quadrant under visualization, and there were no injuries to any abdominal organs.  There was some midline omental adhesions that were seen.  A 5mm trochar to the RLQ under direct visualization.  The omental adhesions were taken down with sharp dissection and cautery.  Once the midline adhesions were taken down an 11 mm port was then placed in the umbilical region after infiltrating with local anesthesia under direct visualization. A third epigastric port was placed under direct visualization.    The gallbladder was identified and retracted, the peritoneum was then sharply dissected from the gallbladder and this dissection was carried down to Calot's triangle. The gallbladder was identified and stripped away circumferentially and seen going into the gallbladder 360, the critical angle was obtained.  2 clips were placed proximally one distally and the cystic duct transected. The cystic artery was identified  and 2 clips placed proximally and one distally and transected.  We then proceeded to remove the gallbladder off the hepatic fossa with Bovie cautery. A retrieval bag was then placed in the abdomen and gallbladder placed in the bag. The hepatic fossa was then reexamined and hemostasis was achieved with Bovie cautery and was excellent at the end of the case.   The subhepatic fossa and perihepatic fossa was then irrigated until the effluent was clear.  The gallbladder and bag were removed from the abdominal cavity. The 11 mm trocar fascia was reapproximated with the Endo Close #1 Vicryl x1.  The pneumoperitoneum was evacuated and all trochars removed under direct visulalization.  The skin was then closed with 4-0 Monocryl and the skin dressed with Dermabond.    The patient was awaken from general anesthesia and taken to the recovery room in stable condition.   PLAN OF CARE: Admit for overnight observation  PATIENT DISPOSITION:  PACU - hemodynamically stable.   Delay start of Pharmacological VTE agent (>24hrs) due to surgical blood loss or risk of bleeding: not applicable

## 2018-03-27 ENCOUNTER — Encounter (HOSPITAL_COMMUNITY): Payer: Self-pay | Admitting: General Surgery

## 2018-03-27 ENCOUNTER — Telehealth: Payer: Self-pay | Admitting: *Deleted

## 2018-03-27 LAB — BASIC METABOLIC PANEL
ANION GAP: 9 (ref 5–15)
BUN: 8 mg/dL (ref 6–20)
CALCIUM: 8.9 mg/dL (ref 8.9–10.3)
CO2: 22 mmol/L (ref 22–32)
Chloride: 106 mmol/L (ref 98–111)
Creatinine, Ser: 1.22 mg/dL (ref 0.61–1.24)
GFR calc Af Amer: >60 mL/min (ref >60)
GFR calc non Af Amer: >60 mL/min (ref >60)
Glucose, Bld: 112 mg/dL — ABNORMAL HIGH (ref 70–99)
Potassium: 4.1 mmol/L (ref 3.5–5.1)
Sodium: 137 mmol/L (ref 135–145)

## 2018-03-27 LAB — CBC
HCT: 39.6 % (ref 39.0–52.0)
HEMOGLOBIN: 13.1 g/dL (ref 13.0–17.0)
MCH: 28.1 pg (ref 26.0–34.0)
MCHC: 33.1 g/dL (ref 30.0–36.0)
MCV: 84.8 fL (ref 78.0–100.0)
Platelets: 273 10*3/uL (ref 150–400)
RBC: 4.67 MIL/uL (ref 4.22–5.81)
RDW: 13.2 % (ref 11.5–15.5)
WBC: 9.1 10*3/uL (ref 4.0–10.5)

## 2018-03-27 LAB — HEPATIC FUNCTION PANEL
ALT: 105 U/L — ABNORMAL HIGH (ref 0–44)
AST: 60 U/L — ABNORMAL HIGH (ref 15–41)
Albumin: 3 g/dL — ABNORMAL LOW (ref 3.5–5.0)
Alkaline Phosphatase: 97 U/L (ref 38–126)
BILIRUBIN DIRECT: 2.7 mg/dL — AB (ref 0.0–0.2)
Indirect Bilirubin: 2.8 mg/dL — ABNORMAL HIGH (ref 0.3–0.9)
Total Bilirubin: 5.5 mg/dL — ABNORMAL HIGH (ref 0.3–1.2)
Total Protein: 6.7 g/dL (ref 6.5–8.1)

## 2018-03-27 MED ORDER — AMLODIPINE BESYLATE 5 MG PO TABS: 5.0000 mg | ORAL_TABLET | Freq: Every day | ORAL | 0 refills | Status: AC

## 2018-03-27 MED ORDER — OXYCODONE HCL 5 MG PO TABS: 5.0000 mg | ORAL_TABLET | Freq: Four times a day (QID) | ORAL | 0 refills | Status: AC | PRN
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
## 2018-03-27 NOTE — Progress Notes (Signed)
Discharge instructions reviewed with the Pt and family member. Instructions given to Pt  Pt discharged to home with family member

## 2018-03-27 NOTE — Telephone Encounter (Signed)
Pharmacy called related to inactive MATCH card.  EDCM researched [facility] system to find that pt medication was entered incorrectly.  EDCM corrected card and stayed on phone to insure card was active. Pt was able to purchase Rx.

## 2018-03-27 NOTE — Plan of Care (Signed)
  Problem: Health Behavior/Discharge Planning: Goal: Ability to manage health-related needs will improve Outcome: Progressing   Problem: Activity: Goal: Risk for activity intolerance will decrease Outcome: Progressing   Problem: Nutrition: Goal: Adequate nutrition will be maintained Outcome: Progressing   

## 2018-03-27 NOTE — Progress Notes (Signed)
[facility] Progress Note  1 Day Post-Op  Subjective: CC:  Pain controlled. Tolerating PO. Urinating without symptoms. Mobilizing in his room.  Objective: Vital signs in last 24 hours: Temp:  [97.5 F (36.4 C)-98.8 F (37.1 C)] 98.5 F (36.9 C) (08/08 0501) Pulse Rate:  [55-93] 55 (08/08 0501) Resp:  [15-23] 16 (08/08 0501) BP: (129-163)/(77-114) 129/89 (08/08 0501) SpO2:  [90 %-98 %] 96 % (08/08 0501) Last BM Date: 03/25/18  Intake/Output from previous day: 08/07 0701 - 08/08 0700 In: 1877.6 [P.O.:742; I.V.:1135.6] Out: 1320 [Urine:1300; Blood:20] Intake/Output this shift: No intake/output data recorded.  PE: Gen:  Alert, NAD, pleasant Card:  Regular rate and rhythm Pulm:  Normal effort, clear to auscultation bilaterally Abd: Soft, non-tender, non-distended, bowel sounds present in all 4 quadrants, incisions C/D/I Skin: warm and dry, no rashes  Psych: A&Ox3   Lab Results:  Recent Labs    03/26/18 0601 03/27/18 0500  WBC 4.1 9.1  HGB 12.9* 13.1  HCT 39.0 39.6  PLT 209 273   BMET Recent Labs    03/26/18 0601 03/27/18 0500  NA 135 137  K 3.7 4.1  CL 103 106  CO2 23 22  GLUCOSE 201* 112*  BUN 6 8  CREATININE 1.51* 1.22  CALCIUM 8.6* 8.9   PT/INR No results for input(s): LABPROT, INR in the last 72 hours. CMP     Component Value Date/Time   NA 137 03/27/2018 0500   K 4.1 03/27/2018 0500   CL 106 03/27/2018 0500   CO2 22 03/27/2018 0500   GLUCOSE 112 (H) 03/27/2018 0500   BUN 8 03/27/2018 0500   CREATININE 1.22 03/27/2018 0500   CALCIUM 8.9 03/27/2018 0500   PROT 6.7 03/27/2018 0500   ALBUMIN 3.0 (L) 03/27/2018 0500   AST 60 (H) 03/27/2018 0500   ALT 105 (H) 03/27/2018 0500   ALKPHOS 97 03/27/2018 0500   BILITOT 5.5 (H) 03/27/2018 0500   GFRNONAA >60 03/27/2018 0500   GFRAA >60 03/27/2018 0500   Lipase     Component Value Date/Time   LIPASE 43 03/24/2018 1140       Studies/Results: No results  found.  Anti-infectives: Anti-infectives (From admission, onward)   Start     Dose/Rate Route Frequency Ordered Stop   03/25/18 0600  piperacillin-tazobactam (ZOSYN) IVPB 3.375 g  Status:  Discontinued     3.375 g 12.5 mL/hr over 240 Minutes Intravenous Every 8 hours 03/25/18 0030 03/26/18 1358   03/25/18 0045  piperacillin-tazobactam (ZOSYN) IVPB 3.375 g     3.375 g 100 mL/hr over 30 Minutes Intravenous  Once 03/25/18 0030 03/25/18 0121       Assessment/Plan POD#1 S/P laparoscopic cholecystectomy - afebrile, VSS - tolerating PO without N/V - pain controlled - stable for discharge. Follow up and work note provided.  LOS: 3 days    Adam PhenixElizabeth S Ahkeem Goede , [facility] 03/27/2018, 8:41 AM Pager: [phone] Consults: [phone] Mon-Fri 7:00 am-4:30 pm Sat-Sun 7:00 am-11:30 am

## 2018-03-27 NOTE — Care Management Note (Signed)
Case Management Note  Patient Details  Name: Michael SimmeringRobert Barber MRN: [id number] Date of Birth: 06-06-1971  Subjective/Objective:                    Action/Plan:  Patient has [facility] on April 29, 2018 at 2:30 pm . Patient has information on same.   Provided and explained MATCH letter , patient voiced understanding. Expected Discharge Date:                  Expected Discharge Plan:  Home/Self Care  In-House Referral:  Financial Counselor  Discharge planning Services  CM Consult, Medication Assistance, [facility]  Post Acute Care Choice:  NA Choice offered to:  Patient, Spouse  DME Arranged:  N/A DME Agency:  NA  HH Arranged:    HH Agency:  NA  Status of Service:  Completed, signed off  If discussed at Long Length of Stay Meetings, dates discussed:    Additional Comments:  Kingsley PlanWile, Pama Roskos Marie, RN 03/27/2018, 1:44 PM

## 2018-03-29 LAB — CULTURE, BLOOD (ROUTINE X 2)
CULTURE: NO GROWTH
Culture: NO GROWTH
SPECIAL REQUESTS: ADEQUATE
Special Requests: ADEQUATE

## 2018-04-29 ENCOUNTER — Inpatient Hospital Stay (INDEPENDENT_AMBULATORY_CARE_PROVIDER_SITE_OTHER): Payer: Self-pay | Admitting: Physician Assistant

## 2020-05-30 IMAGING — MR MR 3D RECON AT SCANNER
13 of 20 series · 28 of 48 positions shown · IV contrast (multihance)
Comparison: No priors.

CLINICAL DATA: 47-year-old male with history of abdominal pain in
the epigastric region and right upper quadrant since yesterday
described as intermittent without radiation, with some associated
nausea and vomiting. Yellow eyes and dark urine. Cholelithiasis with
gallbladder wall thickening noted on recent abdominal ultrasound.

EXAM:
MRI ABDOMEN WITHOUT AND WITH CONTRAST (INCLUDING MRCP)
TECHNIQUE: Multiplanar multisequence MR imaging of the abdomen was performed
both before and after the administration of intravenous contrast.
Heavily T2-weighted images of the biliary and pancreatic ducts were
obtained, and three-dimensional MRCP images were rendered by post
processing.
CONTRAST:  20mL MULTIHANCE GADOBENATE DIMEGLUMINE 529 MG/ML IV SOLN

[Series 3: cor tru fisp · coronal · 4.0mm · 0.74mm/px · 3 of 55 slices shown]
[im 1/55]
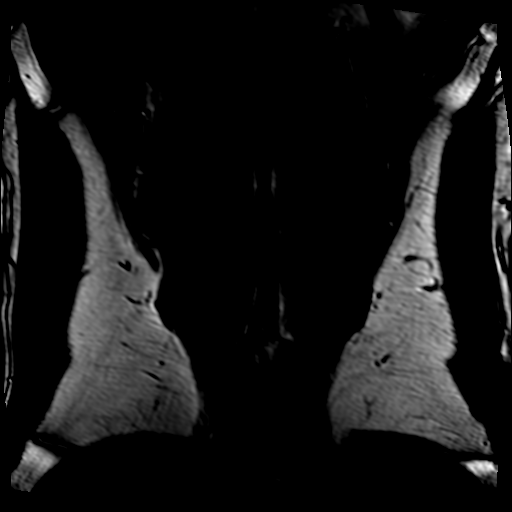
[im 28/55]
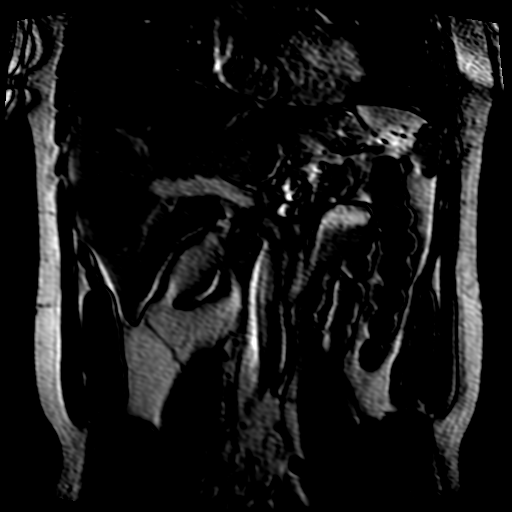
[im 55/55]
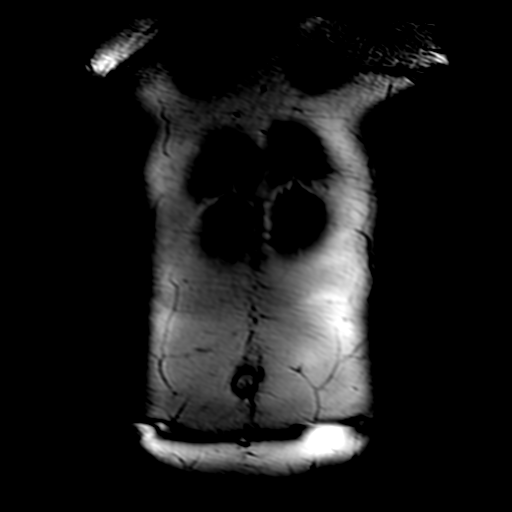

[Series 4: T2 fat-sat · axial · 7.0mm · 0.74mm/px · z∈[-59,+167]mm · 2 of 28 slices shown]
[im 1/28]
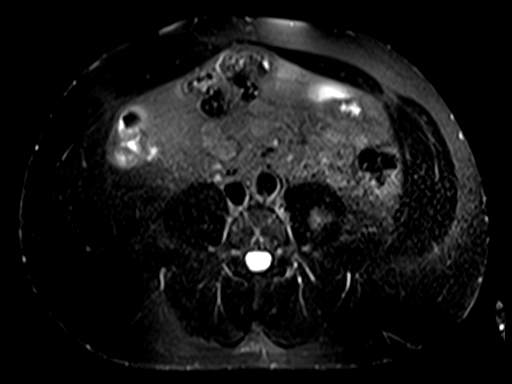
[im 28/28]
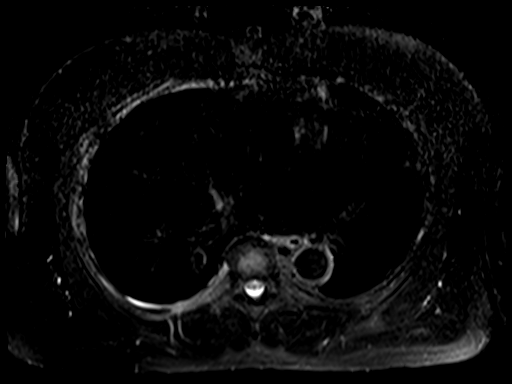

[Series 5: T2 · axial · 7.0mm · 0.74mm/px · 1 of 28 slices shown]
[im 1/28]
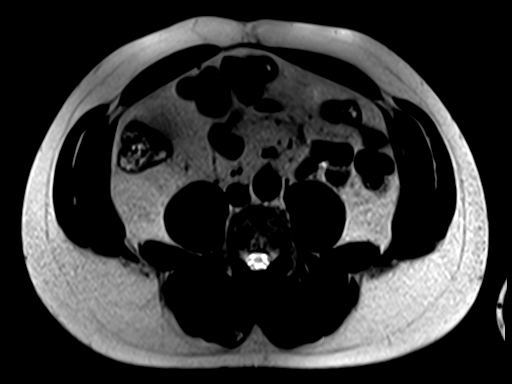

[Series 6: ax dual echo · axial · 7.0mm · 0.74mm/px · z∈[-59,+167]mm · 2 of 56 slices shown]
[im 1/56]
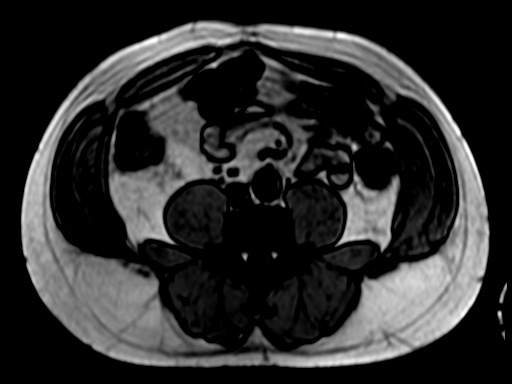
[im 56/56]
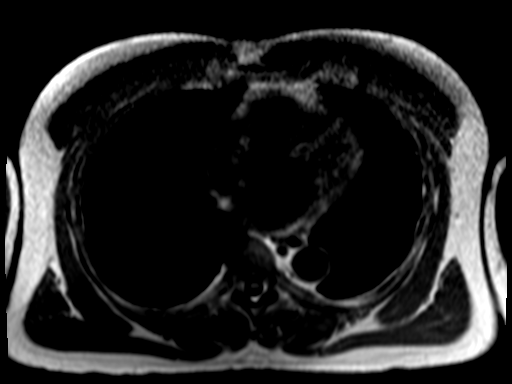

[Series 10: MRCP · coronal · 40.0mm · 0.91mm/px · 1 of 6 slices shown]
[im 1/6]
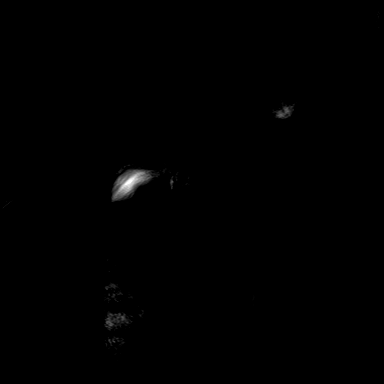

[Series 11: cor thins · coronal · 4.0mm · 0.89mm/px · 1 of 21 slices shown]
[im 1/21]
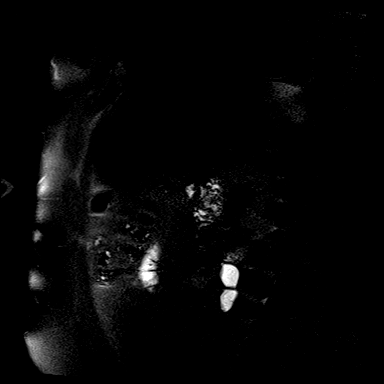

[Series 13: DWI · axial · 6.0mm · 1.77mm/px · z∈[-77,+211]mm · 4 of 123 slices shown]
[im 1/123]
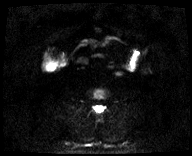
[im 41/123]
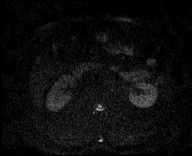
[im 82/123]
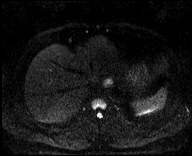
[im 123/123]
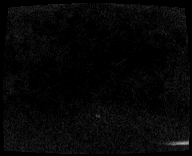

[Series 14: ax dwi_adc · axial · 6.0mm · 1.77mm/px · 1 of 41 slices shown]
[im 1/41]
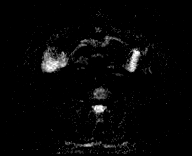

[Series 15: T1 dynamic fat-sat · axial · non-contrast · 2.5mm · 0.74mm/px · z∈[-65,+173]mm · 3 of 96 slices shown (1 of 4)]
[im 1/96]
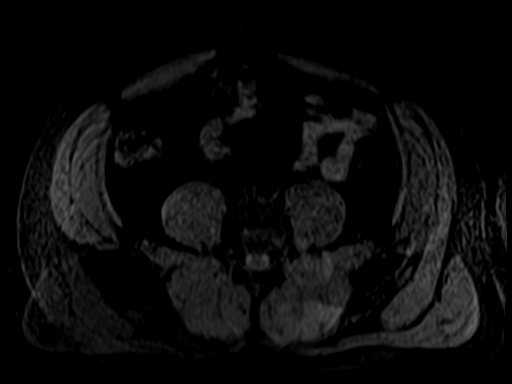
[im 48/96]
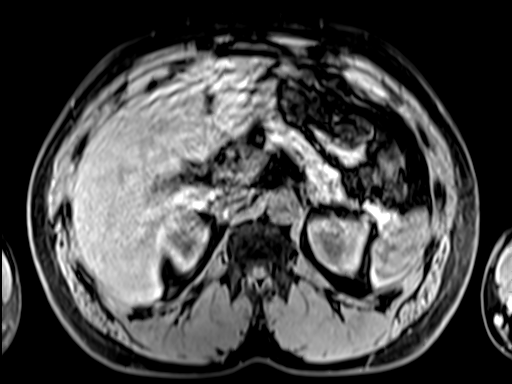
[im 96/96]
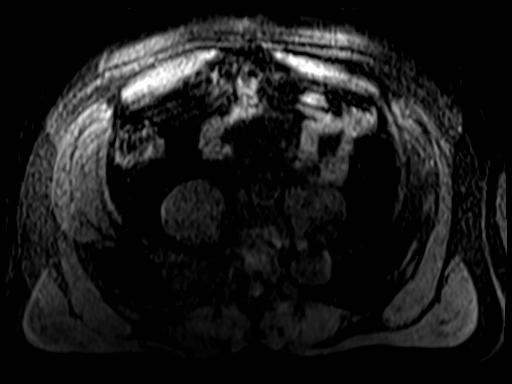

[Series 16: T1 dynamic fat-sat · axial · 2.5mm · 0.74mm/px · z∈[-65,+173]mm · 3 of 96 slices shown (2 of 4)]
[im 1/96]
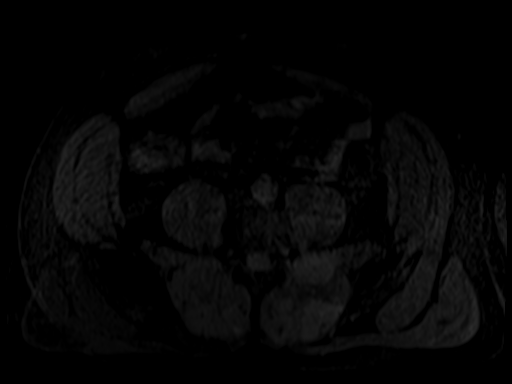
[im 48/96]
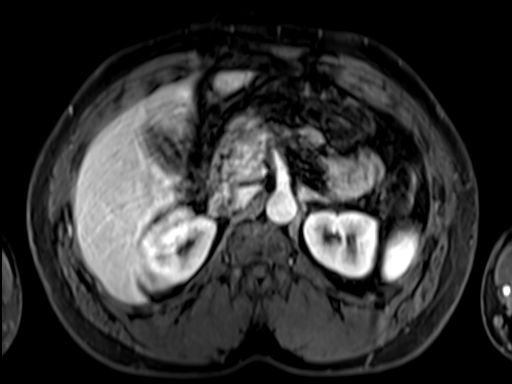
[im 96/96]
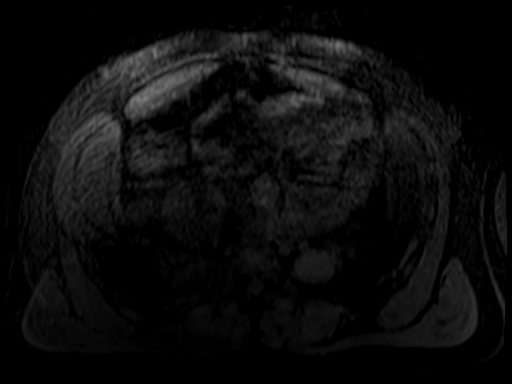

[Series 17: T1 dynamic fat-sat · axial · 2.5mm · 0.74mm/px · z∈[-65,+173]mm · 3 of 96 slices shown (3 of 4)]
[im 1/96]
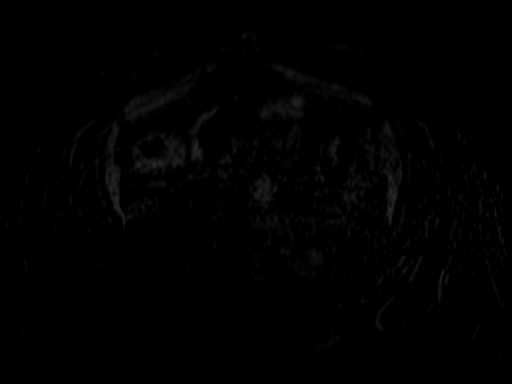
[im 48/96]
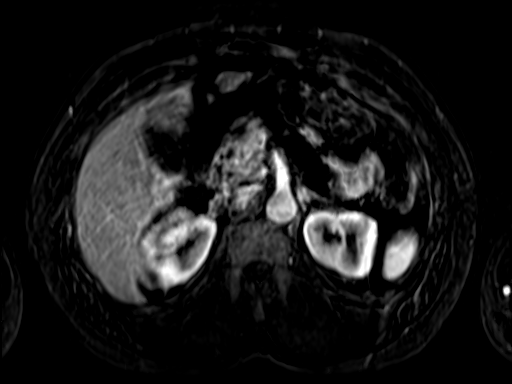
[im 96/96]
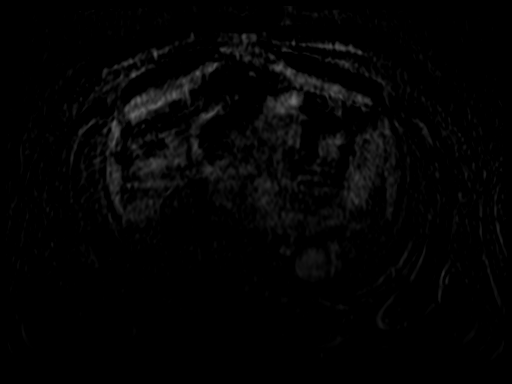

[Series 18: T1 dynamic fat-sat post-contrast · axial · 2.5mm · 0.74mm/px · z∈[-65,+173]mm · 3 of 96 slices shown]
[im 1/96]
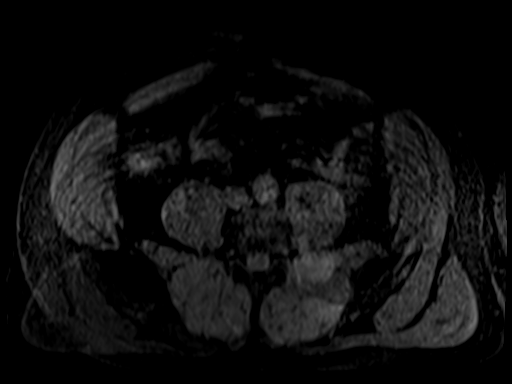
[im 48/96]
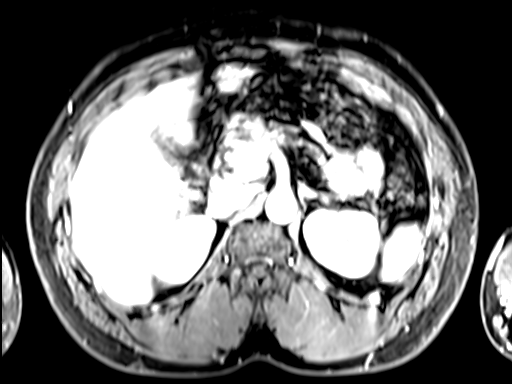
[im 96/96]
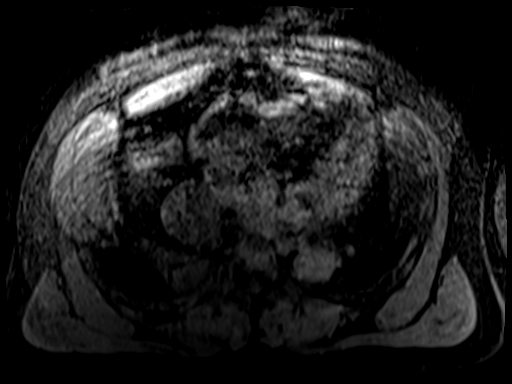

[Series 19: T1 dynamic fat-sat · axial · 2.5mm · 0.74mm/px · 1 of 96 slices shown (4 of 4)]
[im 1/96]
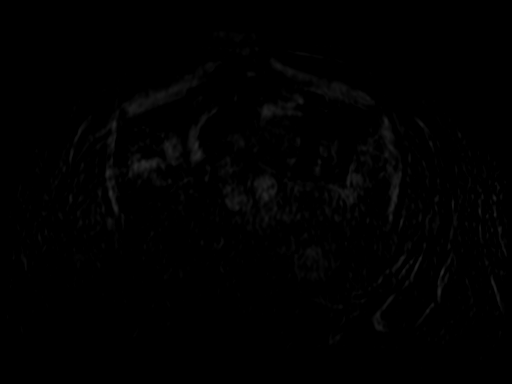

[28 of 48 positions shown; findings below may reference images not displayed]

FINDINGS: Lower chest: Unremarkable.

Hepatobiliary: No suspicious cystic or solid hepatic lesions are
noted. No intra or extrahepatic biliary ductal dilatation. Small
defects are noted in the neck of the gallbladder, corresponding to
the known gallstones. Gallbladder wall appears edematous. No
definite pericholecystic fluid. Common bile duct is normal in
caliber measuring 5 mm in the porta hepatis. No definite filling
defects within the common bile duct to suggest choledocholithiasis.

Pancreas: No pancreatic mass. No pancreatic ductal dilatation noted
on MRCP images. No pancreatic or peripancreatic fluid collections or
inflammatory changes.

Spleen:  Unremarkable.

Adrenals/Urinary Tract: Subcentimeter T1 hypointense, T2
hyperintense, nonenhancing lesion in the upper pole the left kidney
is compatible with a tiny simple cyst. No suspicious renal lesions.
No hydroureteronephrosis in the visualized portions of the abdomen.
Bilateral adrenal glands are normal in appearance.

Stomach/Bowel: Visualized portions are unremarkable.

Vascular/Lymphatic: No aneurysm identified in the visualized
abdominal vasculature. No lymphadenopathy noted in the abdomen.

Other: No significant volume of ascites noted in the visualized
portions of the peritoneal cavity.

Musculoskeletal: No aggressive appearing osseous lesions are noted
in the visualized portions of the skeleton.
IMPRESSION: 1. Cholelithiasis with diffuse gallbladder wall edema. These
findings could indicate an acute cholecystitis, and further clinical
evaluation is recommended.
2. No evidence of choledocholithiasis. No findings to suggest
biliary tract obstruction.

## 2023-01-09 ENCOUNTER — Institutional Professional Consult (permissible substitution): Payer: BC Managed Care – PPO | Admitting: Primary Care

## 2023-01-25 ENCOUNTER — Ambulatory Visit (INDEPENDENT_AMBULATORY_CARE_PROVIDER_SITE_OTHER): Payer: BC Managed Care – PPO | Admitting: Primary Care

## 2023-01-25 ENCOUNTER — Encounter: Payer: Self-pay | Admitting: Primary Care

## 2023-01-25 VITALS — BP 112/82 | HR 78 | Temp 97.9°F | Ht 69.0 in | Wt 214.2 lb

## 2023-01-25 DIAGNOSIS — R0683 Snoring: Secondary | ICD-10-CM | POA: Diagnosis not present

## 2023-01-25 NOTE — Patient Instructions (Addendum)
  Sleep apnea is defined as period of 10 seconds or longer when you stop breathing at night. This can happen multiple times a night. Dx sleep apnea is when this occurs more than 5 times an hour.    Mild OSA 5-15 apneic events an hour Moderate OSA 15-30 apneic events an hour Severe OSA > 30 apneic events an hour   Untreated sleep apnea puts you at higher risk for cardiac arrhythmias, pulmonary HTN, stroke and diabetes   Treatment options include weight loss, side sleeping position, oral appliance, CPAP therapy or referral to ENT for possible surgical options    Recommendations: - Our office or [facility] will reach out to you to schedule home sleep study (May take 4 to 6 weeks) - Focus on side sleeping position or elevate head with wedge pillow 30 degrees - Work on weight loss efforts if able  - Do not drive if experiencing excessive daytime sleepiness of fatigue    Orders: - Home sleep study re: loud snoring (ordered)    Follow-up: - If sleep study is positive we will call you to set up a follow-up visit to review sleep study and treatment options

## 2023-01-25 NOTE — Progress Notes (Signed)
@Patient  ID: Michael Barber, male    DOB: [birth year], 52 y.o.   MRN: [id number]  Chief Complaint  Patient presents with   Consult    Epworth 5     Referring provider: Filomena Jungling, NP  HPI: 52 year old male, never smoked (uses smokeless tobacco).  History significant for hypertension, cholecystitis, elevated LFTs.  01/25/2023 Patient presents today for sleep consult. Referred by primary care. He has been told that he snores loudly by his girlfriend. Generally he feels he sleeps alright at night. Typical bedtime is between 9-10pm. He falls asleep in 60 mins while watching TV in bed. He wakes up 2 times to use the restrooms. He starts his day between 4:30-5am. He works driving box trucks. He feels sluggish and tired mid-day. He has never fallen asleep while driving.  No concern for narcolepsy, cataplexy or sleepwalking.  Sleep questionnaire Symptoms- snoring, daytime sleepiness     Prior sleep study- None  Bedtime- 9-10pm Time to fall asleep- 1 hour Nocturnal awakenings- 2 times Out of bed/start of day- 4:30-5am Weight changes- stable  Do you operate heavy machinery- yes Do you currently wear CPAP- no Do you current wear oxygen- no Epworth- 5  No Known Allergies   There is no immunization history on file for this patient.  Past Medical History:  Diagnosis Date   Hypertension    Medical history non-contributory     Tobacco History: Social History   Tobacco Use  Smoking Status Never  Smokeless Tobacco Current   Types: Chew   Ready to quit: Not Answered Counseling given: Not Answered   Outpatient Medications Prior to Visit  Medication Sig Dispense Refill   lisinopril (ZESTRIL) 10 MG tablet Take by mouth.     montelukast (SINGULAIR) 10 MG tablet Take by mouth.     amLODipine (NORVASC) 5 MG tablet Take 1 tablet (5 mg total) by mouth daily. (Patient not taking: Reported on 01/25/2023) 30 tablet 0   oxyCODONE (OXY IR/ROXICODONE) 5 MG immediate release tablet Take 1  tablet (5 mg total) by mouth every 6 (six) hours as needed for moderate pain. (Patient not taking: Reported on 01/25/2023) 20 tablet 0   No facility-administered medications prior to visit.   Review of Systems  Review of Systems  Constitutional: Negative.   HENT: Negative.    Respiratory: Negative.    Cardiovascular: Negative.   Psychiatric/Behavioral:  Positive for sleep disturbance.    Physical Exam  BP 112/82 (BP Location: Left Arm, Patient Position: Sitting, Cuff Size: Normal)   Pulse 78   Temp 97.9 F (36.6 C) (Temporal)   Ht 5\' 9"  (1.753 m)   Wt 214 lb 3.2 oz (97.2 kg)   SpO2 96%   BMI 31.63 kg/m  Physical Exam Constitutional:      Appearance: Normal appearance.  HENT:     Head: Normocephalic and atraumatic.     Mouth/Throat:     Mouth: Mucous membranes are moist.     Pharynx: Oropharynx is clear.  Cardiovascular:     Rate and Rhythm: Normal rate and regular rhythm.  Pulmonary:     Effort: Pulmonary effort is normal.     Breath sounds: Normal breath sounds.  Musculoskeletal:        General: Normal range of motion.  Skin:    General: Skin is warm and dry.  Neurological:     General: No focal deficit present.     Mental Status: He is alert and oriented to person, place, and time. Mental status is at  baseline.  Psychiatric:        Mood and Affect: Mood normal.        Behavior: Behavior normal.        Thought Content: Thought content normal.        Judgment: Judgment normal.      Lab Results:  CBC    Component Value Date/Time   WBC 9.1 03/27/2018 0500   RBC 4.67 03/27/2018 0500   HGB 13.1 03/27/2018 0500   HCT 39.6 03/27/2018 0500   PLT 273 03/27/2018 0500   MCV 84.8 03/27/2018 0500   MCH 28.1 03/27/2018 0500   MCHC 33.1 03/27/2018 0500   RDW 13.2 03/27/2018 0500   LYMPHSABS 0.9 03/25/2018 0455   MONOABS 0.6 03/25/2018 0455   EOSABS 0.0 03/25/2018 0455   BASOSABS 0.0 03/25/2018 0455    BMET    Component Value Date/Time   NA 137 03/27/2018  0500   K 4.1 03/27/2018 0500   CL 106 03/27/2018 0500   CO2 22 03/27/2018 0500   GLUCOSE 112 (H) 03/27/2018 0500   BUN 8 03/27/2018 0500   CREATININE 1.22 03/27/2018 0500   CALCIUM 8.9 03/27/2018 0500   GFRNONAA >60 03/27/2018 0500   GFRAA >60 03/27/2018 0500    BNP No results found for: "BNP"  ProBNP No results found for: "PROBNP"  Imaging: No results found.   Assessment & Plan:   Loud snoring - Patient has symptoms of loud snoring, disrupted sleep and daytime fatigue. Medical history significant for hypertension. Epworth 5. BMI 31. Concern patient could have underlying obstructive sleep apnea, needs home sleep study to evaluate.  We reviewed risks of untreated sleep apnea including cardiac arrhythmias, pulmonary hypertension and diabetes.  We also discussed treatment options including weight loss, oral appliance, CPAP therapy or referral to ENT for possible surgical options.  Encourage side sleeping position.  Patient educated to avoid alcohol use prior to bedtime as this can worsen sleep apnea.  Advised against driving if experiencing excessive daytime sleepiness fatigue.  Follow-up 1 to 2 weeks after sleep study to review results and treatment options if needed.  Michael Bayley, NP 01/25/2023

## 2023-01-25 NOTE — Assessment & Plan Note (Addendum)
-   Patient has symptoms of loud snoring, disrupted sleep and daytime fatigue. Medical history significant for hypertension. Epworth 5. BMI 31. Concern patient could have underlying obstructive sleep apnea, needs home sleep study to evaluate.  We reviewed risks of untreated sleep apnea including cardiac arrhythmias, pulmonary hypertension and diabetes.  We also discussed treatment options including weight loss, oral appliance, CPAP therapy or referral to ENT for possible surgical options.  Encourage side sleeping position.  Patient educated to avoid alcohol use prior to bedtime as this can worsen sleep apnea.  Advised against driving if experiencing excessive daytime sleepiness fatigue.  Follow-up 1 to 2 weeks after sleep study to review results and treatment options if needed.

## 2023-01-28 NOTE — Progress Notes (Signed)
Reviewed and agree with assessment/plan.   Coralyn Helling, MD [facility] Pulmonary/Critical Care 01/28/2023, 7:09 AM Pager:  [phone]

## 2023-10-12 ENCOUNTER — Other Ambulatory Visit: Payer: Self-pay

## 2023-10-12 ENCOUNTER — Encounter: Payer: Self-pay | Admitting: Emergency Medicine

## 2023-10-12 ENCOUNTER — Ambulatory Visit
Admission: EM | Admit: 2023-10-12 | Discharge: 2023-10-12 | Disposition: A | Discharge status: Home / Self Care | Payer: BC Managed Care – PPO | Attending: Family Medicine | Admitting: Family Medicine

## 2023-10-12 DIAGNOSIS — J111 Influenza due to unidentified influenza virus with other respiratory manifestations: Secondary | ICD-10-CM | POA: Diagnosis not present

## 2023-10-12 LAB — POC COVID19/FLU A&B COMBO
Covid Antigen, POC: NEGATIVE
Influenza A Antigen, POC: NEGATIVE
Influenza B Antigen, POC: NEGATIVE

## 2023-10-12 MED ORDER — OSELTAMIVIR PHOSPHATE 75 MG PO CAPS: 75.0000 mg | ORAL_CAPSULE | Freq: Two times a day (BID) | ORAL | 0 refills | Status: AC

## 2023-10-12 MED ORDER — BENZONATATE 100 MG PO CAPS: 100.0000 mg | ORAL_CAPSULE | Freq: Three times a day (TID) | ORAL | 0 refills | Status: AC | PRN

## 2023-10-12 MED ORDER — ACETAMINOPHEN 325 MG PO TABS
650.0000 mg | ORAL_TABLET | Freq: Once | ORAL | Status: AC
  Administered 2023-10-12: 650 mg via ORAL

## 2023-10-12 NOTE — Discharge Instructions (Addendum)
 Your test for flu and COVID was negative.  I think it is still likely that you have the flu.  Take oseltamivir 75 mg--1 capsule 2 times daily for 5 days  Take benzonatate 100 mg, 1 tab every 8 hours as needed for cough.  Make sure you are drinking enough fluids.
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
## 2023-10-12 NOTE — ED Triage Notes (Signed)
 Fever, nausea, chills, cough, night sweats. Patient also has had general body aches  Has taken theraflu and ibuprofen.
